# Patient Record
Sex: Female | Born: 1975 | Race: Black or African American | Hispanic: No | Marital: Married | Smoking: Never smoker
Health system: Southern US, Community
[De-identification: ages and names within clinical notes are randomized; demographics above are authoritative.]

## PROBLEM LIST (undated history)

## (undated) DIAGNOSIS — R7303 Prediabetes: Secondary | ICD-10-CM

## (undated) DIAGNOSIS — R519 Headache, unspecified: Secondary | ICD-10-CM

## (undated) DIAGNOSIS — F32A Depression, unspecified: Secondary | ICD-10-CM

## (undated) DIAGNOSIS — T7840XA Allergy, unspecified, initial encounter: Secondary | ICD-10-CM

## (undated) DIAGNOSIS — G709 Myoneural disorder, unspecified: Secondary | ICD-10-CM

## (undated) DIAGNOSIS — E669 Obesity, unspecified: Secondary | ICD-10-CM

## (undated) DIAGNOSIS — I10 Essential (primary) hypertension: Secondary | ICD-10-CM

## (undated) DIAGNOSIS — R011 Cardiac murmur, unspecified: Secondary | ICD-10-CM

## (undated) DIAGNOSIS — E079 Disorder of thyroid, unspecified: Secondary | ICD-10-CM

## (undated) DIAGNOSIS — D649 Anemia, unspecified: Secondary | ICD-10-CM

## (undated) DIAGNOSIS — K219 Gastro-esophageal reflux disease without esophagitis: Secondary | ICD-10-CM

## (undated) DIAGNOSIS — N200 Calculus of kidney: Secondary | ICD-10-CM

## (undated) DIAGNOSIS — K760 Fatty (change of) liver, not elsewhere classified: Secondary | ICD-10-CM

## (undated) DIAGNOSIS — N6001 Solitary cyst of right breast: Secondary | ICD-10-CM

## (undated) HISTORY — DX: Cardiac murmur, unspecified: R01.1

## (undated) HISTORY — DX: Headache, unspecified: R51.9

## (undated) HISTORY — DX: Obesity, unspecified: E66.9

## (undated) HISTORY — PX: ABDOMINAL HYSTERECTOMY: SHX81

## (undated) HISTORY — PX: TUBAL LIGATION: SHX77

## (undated) HISTORY — DX: Allergy, unspecified, initial encounter: T78.40XA

## (undated) HISTORY — DX: Calculus of kidney: N20.0

## (undated) HISTORY — DX: Anemia, unspecified: D64.9

## (undated) HISTORY — DX: Essential (primary) hypertension: I10

## (undated) HISTORY — DX: Depression, unspecified: F32.A

## (undated) HISTORY — DX: Fatty (change of) liver, not elsewhere classified: K76.0

## (undated) HISTORY — DX: Prediabetes: R73.03

## (undated) HISTORY — DX: Gastro-esophageal reflux disease without esophagitis: K21.9

## (undated) HISTORY — DX: Disorder of thyroid, unspecified: E07.9

## (undated) HISTORY — DX: Myoneural disorder, unspecified: G70.9

---

## 2009-03-28 ENCOUNTER — Ambulatory Visit: Payer: Self-pay | Admitting: Family Medicine

## 2009-03-28 DIAGNOSIS — M546 Pain in thoracic spine: Secondary | ICD-10-CM | POA: Insufficient documentation

## 2013-12-24 ENCOUNTER — Encounter: Payer: Self-pay | Admitting: Emergency Medicine

## 2013-12-24 ENCOUNTER — Emergency Department (INDEPENDENT_AMBULATORY_CARE_PROVIDER_SITE_OTHER)
Admission: EM | Admit: 2013-12-24 | Discharge: 2013-12-24 | Disposition: A | Discharge status: Home / Self Care | Payer: BC Managed Care – PPO | Source: Home / Self Care | Attending: Family Medicine | Admitting: Family Medicine

## 2013-12-24 DIAGNOSIS — K219 Gastro-esophageal reflux disease without esophagitis: Secondary | ICD-10-CM

## 2013-12-24 MED ORDER — OMEPRAZOLE 40 MG PO CPDR: 40.0000 mg | DELAYED_RELEASE_CAPSULE | Freq: Every day | ORAL | Status: DC

## 2013-12-24 NOTE — ED Provider Notes (Signed)
CSN: [id number]     Arrival date & time 12/24/13  5009 History   First MD Initiated Contact with Patient 12/24/13 [phone]     Chief Complaint  Patient presents with  . Gastrophageal Reflux      HPI Comments: Patient reports that 5 days ago she ate some tacos and then slept for two hours.  Afterwards she developed a cough, worse at night and early morning, and irritation in her throat.  She has been hoarse for 3 days.  Patient is a 38 y.o. female presenting with GERD. The history is provided by the patient.  Gastrophageal Reflux This is a new problem. Episode onset: 5 days ago. The problem occurs daily. The problem has not changed since onset.Associated symptoms include chest pain. Pertinent negatives include no abdominal pain and no shortness of breath. The symptoms are aggravated by eating (supine in bed). Nothing relieves the symptoms. She has tried nothing for the symptoms.    History reviewed. No pertinent past medical history. Past Surgical History  Procedure Laterality Date  . Abdominal hysterectomy    . Tubal ligation     Family History  Problem Relation Age of Onset  . Hypertension Mother   . Gout Mother   . Hyperlipidemia Father   . Hypertension Brother   . Gout Brother    History  Substance Use Topics  . Smoking status: Never Smoker   . Smokeless tobacco: Not on file  . Alcohol Use: No   OB History   Grav Para Term Preterm Abortions TAB SAB Ect Mult Living                 Review of Systems  Respiratory: Negative for shortness of breath.   Cardiovascular: Positive for chest pain.  Gastrointestinal: Negative for nausea, vomiting, abdominal pain, blood in stool and abdominal distention.  All other systems reviewed and are negative.   Allergies  Latex and Morphine and related  Home Medications   Prior to Admission medications   Medication Sig Start Date End Date Taking? Authorizing Provider  omeprazole (PRILOSEC) 40 MG capsule Take 1 capsule (40 mg total) by  mouth daily. Take 30 minutes before a meal. 12/24/13   Kandra Nicolas, MD   BP 121/79  Pulse 81  Temp(Src) 98.1 F (36.7 C) (Oral)  Resp 16  Ht 5\' 2"  (1.575 m)  Wt 183 lb (83.008 kg)  BMI 33.46 kg/m2  SpO2 99% Physical Exam Nursing notes and Vital Signs reviewed. Appearance:  Patient appears stated age, and in no acute distress.  Patient is obese (BMI 33.5) Eyes:  Pupils are equal, round, and reactive to light and accomodation.  Extraocular movement is intact.  Conjunctivae are not inflamed  Pharynx:  Normal Neck:  Supple.  No adenopathy Lungs:  Clear to auscultation.  Breath sounds are equal.  Heart:  Regular rate and rhythm without murmurs, rubs, or gallops.  Abdomen:  Nontender without masses or hepatosplenomegaly.  Bowel sounds are present.  No CVA or flank tenderness.  Extremities:  No edema.  No calf tenderness Skin:  No rash present.   ED Course  Procedures  none      MDM   1. Gastroesophageal reflux disease, esophagitis presence not specified    Begin omeprazole. Followup with Family Doctor in about 3 weeks.     Kandra Nicolas, MD 12/27/13 2228

## 2013-12-24 NOTE — ED Notes (Signed)
Pt c/o burning, hoarseness, irritation, and itching in her throat x 5 days after eating tacos. She also c/o a cough.

## 2016-05-22 LAB — HM MAMMOGRAPHY

## 2017-06-17 ENCOUNTER — Ambulatory Visit (INDEPENDENT_AMBULATORY_CARE_PROVIDER_SITE_OTHER): Payer: BLUE CROSS/BLUE SHIELD

## 2017-06-17 ENCOUNTER — Encounter: Payer: Self-pay | Admitting: Physician Assistant

## 2017-06-17 ENCOUNTER — Ambulatory Visit (INDEPENDENT_AMBULATORY_CARE_PROVIDER_SITE_OTHER): Payer: BLUE CROSS/BLUE SHIELD | Admitting: Physician Assistant

## 2017-06-17 VITALS — BP 120/82 | HR 71 | Temp 98.0°F | Resp 16 | Ht 62.0 in | Wt 197.7 lb

## 2017-06-17 DIAGNOSIS — R93429 Abnormal radiologic findings on diagnostic imaging of unspecified kidney: Secondary | ICD-10-CM | POA: Diagnosis not present

## 2017-06-17 DIAGNOSIS — R93422 Abnormal radiologic findings on diagnostic imaging of left kidney: Secondary | ICD-10-CM

## 2017-06-17 DIAGNOSIS — Z131 Encounter for screening for diabetes mellitus: Secondary | ICD-10-CM | POA: Diagnosis not present

## 2017-06-17 DIAGNOSIS — Z87898 Personal history of other specified conditions: Secondary | ICD-10-CM

## 2017-06-17 DIAGNOSIS — Z800 Family history of malignant neoplasm of digestive organs: Secondary | ICD-10-CM | POA: Diagnosis not present

## 2017-06-17 DIAGNOSIS — Z7689 Persons encountering health services in other specified circumstances: Secondary | ICD-10-CM | POA: Diagnosis not present

## 2017-06-17 DIAGNOSIS — Z13220 Encounter for screening for lipoid disorders: Secondary | ICD-10-CM | POA: Diagnosis not present

## 2017-06-17 DIAGNOSIS — Z87442 Personal history of urinary calculi: Secondary | ICD-10-CM | POA: Diagnosis not present

## 2017-06-17 DIAGNOSIS — Z90710 Acquired absence of both cervix and uterus: Secondary | ICD-10-CM

## 2017-06-17 DIAGNOSIS — R739 Hyperglycemia, unspecified: Secondary | ICD-10-CM

## 2017-06-17 DIAGNOSIS — N2889 Other specified disorders of kidney and ureter: Secondary | ICD-10-CM | POA: Diagnosis not present

## 2017-06-17 DIAGNOSIS — Z1231 Encounter for screening mammogram for malignant neoplasm of breast: Secondary | ICD-10-CM

## 2017-06-17 HISTORY — DX: Abnormal radiologic findings on diagnostic imaging of unspecified kidney: R93.429

## 2017-06-17 HISTORY — DX: Acquired absence of both cervix and uterus: Z90.710

## 2017-06-17 HISTORY — DX: Personal history of other specified conditions: Z87.898

## 2017-06-17 LAB — POCT URINALYSIS DIPSTICK
Bilirubin, UA: NEGATIVE
Blood, UA: NEGATIVE
Glucose, UA: NEGATIVE
Ketones, UA: NEGATIVE
Leukocytes, UA: NEGATIVE
Nitrite, UA: NEGATIVE
PH UA: 5.5 (ref 5.0–8.0)
Protein, UA: NEGATIVE
Spec Grav, UA: 1.025 (ref 1.010–1.025)
Urobilinogen, UA: 0.2 E.U./dL

## 2017-06-17 NOTE — Patient Instructions (Addendum)
I have also ordered fasting labs. The lab is a walk-in open M-F 7:30a-4:30p (closed 12:30-1:30p). Nothing to eat or drink after midnight or at least 8 hours before your blood draw. You can have water and your medications.    Physical Activity Recommendations for modifying lipids and lowering blood pressure Engage in aerobic physical activity to reduce LDL-cholesterol, non-HDL-cholesterol, and blood pressure  Frequency: 3-4 sessions per week  Intensity: moderate to vigorous  Duration: 40 minutes on average  Physical Activity Recommendations for secondary prevention 1. Aerobic exercise  Frequency: 3-5 sessions per week  Intensity: 50-80% capacity  Duration: 20 - 60 minutes  Examples: walking, treadmill, cycling, rowing, stair climbing, and arm/leg ergometry  2. Resistance exercise  Frequency: 2-3 sessions per week  Intensity: 10-15 repetitions/set to moderate fatigue  Duration: 1-3 sets of 8-10 upper and lower body exercises  Examples: calisthenics, elastic bands, cuff/hand weights, dumbbels, free weights, wall pulleys, and weight machines  Heart-Healthy Lifestyle  Eating a diet rich in vegetables, fruits and whole grains: also includes low-fat dairy products, poultry, fish, legumes, and nuts; limit intake of sweets, sugar-sweetened beverages and red meats  Getting regular exercise  Maintaining a healthy weight  Not smoking or getting help quitting  Staying on top of your health; for some people, lifestyle changes alone may not be enough to prevent a heart attack or stroke. In these cases, taking a statin at the right dose will most likely be necessary

## 2017-06-17 NOTE — Progress Notes (Signed)
HPI:                                                                Amber Mcmillan is a 42 y.o. female who presents to [facility] today to establish care  Current concerns: "kidney lesion"  CT abdomen on 06/05/17 showed a nonspecific ill-defined hypodensity of the left lower pole of the left kidney. At that time patient presented with left-sided back/flank pain. Reports pain was sharp, stabbing, persistent and worse with coughing. States her back pain resolved on Saturday. Reports history of nephrolithiasis in the right kidney. Denies hematuria, dysuria, frequency, urgency. She does not smoke or use tobacco products  Depression screen PHQ 2/9 06/17/2017  Decreased Interest 0  Down, Depressed, Hopeless 0  PHQ - 2 Score 0    Past Medical History:  Diagnosis Date  . Nephrolithiasis   . Prediabetes 06/19/2017   Past Surgical History:  Procedure Laterality Date  . ABDOMINAL HYSTERECTOMY    . TUBAL LIGATION     Social History   Tobacco Use  . Smoking status: Never Smoker  . Smokeless tobacco: Never Used  Substance Use Topics  . Alcohol use: No   family history includes Colon cancer in her maternal grandmother, paternal uncle, and paternal uncle; Diabetes in her maternal aunt; Gout in her brother and mother; Hyperlipidemia in her father; Hypertension in her brother and mother; Renal Disease in her maternal aunt.  ROS: negative except as noted in the HPI  Medications: No current outpatient medications on file.   No current facility-administered medications for this visit.    Allergies  Allergen Reactions  . Latex   . Morphine And Related        Objective:  BP 120/82   Pulse 71   Temp 98 F (36.7 C) (Oral)   Resp 16   Ht 5\' 2"  (1.575 m)   Wt 197 lb 11.2 oz (89.7 kg)   SpO2 98%   BMI 36.16 kg/m  Gen:  alert, not ill-appearing, no distress, appropriate for age, obese female HEENT: head normocephalic without obvious  abnormality, conjunctiva and cornea clear, trachea midline Pulm: Normal work of breathing, normal phonation, clear to auscultation bilaterally, no wheezes, rales or rhonchi CV: Normal rate, regular rhythm, s1 and s2 distinct, no murmurs, clicks or rubs  GI: abdomen soft, nontender, no CVA tenderness Neuro: alert and oriented x 3, no tremor MSK: extremities atraumatic, normal gait and station Skin: intact, no rashes on exposed skin, no jaundice, no cyanosis Psych: well-groomed, cooperative, good eye contact, euthymic mood, affect mood-congruent, speech is articulate, and thought processes clear and goal-directed   Ref Range & Units 10d ago  Color, UA  yellow   Clarity, UA  clear   Glucose, UA  Negative   Bilirubin, UA  Negative   Ketones, UA  Negative   Spec Grav, UA 1.010 - 1.025 1.025   Blood, UA  Negative   pH, UA 5.0 - 8.0 5.5   Protein, UA  Negative   Urobilinogen, UA 0.2 or 1.0 E.U./dL 0.2   Nitrite, UA  Negative   Leukocytes, UA Negative Negative   Appearance  straw   Odor  yes       Specimen Collected: 06/17/17 10:19 Last  Resulted: 06/17/17 10:19        No results found for this or any previous visit (from the past 72 hour(s)). No results found.    Assessment and Plan: 42 y.o. female with   Encounter to establish care - Reviewed PMH, PSH, PFH, medications and allergies - Personally reviewed labs and CT from 06/05/2017 in [facility] - Reviewed health maintenance - No longer candidate for Pap smear - Mammogram due - Declines influenza - PHQ2 negative  1. Abnormal CT scan, kidney - POCT urinalysis dipstick negative, no hematuria or proteinuria - US RENAL ordered today was not diagnostic and MRI was recommended to better characterize the area - MR Abdomen W Wo Contrast; Future  2. H/O hysterectomy for benign disease  3. Breast cancer screening by mammogram - MM DIGITAL SCREENING BILATERAL; Future  4. History of abnormal mammogram - MM DIGITAL  SCREENING BILATERAL; Future  5. Hyperglycemia - Hemoglobin A1c  6. Screening for lipid disorders - Lipid Panel w/reflex Direct LDL  7. Screening for diabetes mellitus - Hemoglobin A1c   9. Other specified disorders of kidney and ureter - MR Abdomen W Wo Contrast; Future  10. Family history of colon cancer - 3, second-degree relatives  23. History of nephrolithiasis - POCT urinalysis dipstick negative  Patient education and anticipatory guidance given Patient agrees with treatment plan Follow-up in 3 months or sooner as needed if symptoms worsen or fail to improve  Darlyne Russian PA-C

## 2017-06-18 LAB — LIPID PANEL W/REFLEX DIRECT LDL
Cholesterol: 195 mg/dL (ref <200)
HDL: 61 mg/dL (ref >50)
LDL CHOLESTEROL (CALC): 117 mg/dL — AB
NON-HDL CHOLESTEROL (CALC): 134 mg/dL — AB (ref <130)
Total CHOL/HDL Ratio: 3.2 (calc) (ref <5.0)
Triglycerides: 76 mg/dL (ref <150)

## 2017-06-18 LAB — HEMOGLOBIN A1C
Hgb A1c MFr Bld: 5.9 % of total Hgb — ABNORMAL HIGH (ref <5.7)
Mean Plasma Glucose: 123 (calc)
eAG (mmol/L): 6.8 (calc)

## 2017-06-19 ENCOUNTER — Encounter: Payer: Self-pay | Admitting: Physician Assistant

## 2017-06-19 DIAGNOSIS — R7303 Prediabetes: Secondary | ICD-10-CM

## 2017-06-19 HISTORY — DX: Prediabetes: R73.03

## 2017-06-19 NOTE — Progress Notes (Signed)
Labs show prediabetes Cholesterol in a healthy range The ultrasound still shows a poorly defined region on the left kidney and radiologist recommends getting an MRI to better characterize it. I have placed the order

## 2017-06-24 ENCOUNTER — Encounter: Payer: Self-pay | Admitting: Physician Assistant

## 2017-06-27 ENCOUNTER — Encounter: Payer: Self-pay | Admitting: Physician Assistant

## 2017-06-27 DIAGNOSIS — Z800 Family history of malignant neoplasm of digestive organs: Secondary | ICD-10-CM | POA: Insufficient documentation

## 2017-06-27 DIAGNOSIS — N2889 Other specified disorders of kidney and ureter: Secondary | ICD-10-CM | POA: Insufficient documentation

## 2017-06-27 DIAGNOSIS — Z87442 Personal history of urinary calculi: Secondary | ICD-10-CM | POA: Insufficient documentation

## 2017-06-27 HISTORY — DX: Family history of malignant neoplasm of digestive organs: Z80.0

## 2017-06-27 HISTORY — DX: Personal history of urinary calculi: Z87.442

## 2017-07-04 ENCOUNTER — Encounter: Payer: Self-pay | Admitting: Physician Assistant

## 2017-07-04 ENCOUNTER — Ambulatory Visit: Payer: BLUE CROSS/BLUE SHIELD

## 2017-07-04 DIAGNOSIS — K760 Fatty (change of) liver, not elsewhere classified: Secondary | ICD-10-CM

## 2017-07-04 DIAGNOSIS — N2889 Other specified disorders of kidney and ureter: Secondary | ICD-10-CM

## 2017-07-04 DIAGNOSIS — R93429 Abnormal radiologic findings on diagnostic imaging of unspecified kidney: Secondary | ICD-10-CM

## 2017-07-04 HISTORY — DX: Fatty (change of) liver, not elsewhere classified: K76.0

## 2017-07-04 MED ORDER — GADOBENATE DIMEGLUMINE 529 MG/ML IV SOLN
18.0000 mL | Freq: Once | INTRAVENOUS | Status: AC | PRN
  Administered 2017-07-04: 18 mL via INTRAVENOUS

## 2017-07-04 NOTE — Progress Notes (Signed)
Radiologist thinks the area in question is a renal cyst It needs to be monitored to make sure it does not grow or change They recommend repeat MRI in 6 months I will order it now, but she should not have it scheduled until July

## 2017-08-04 ENCOUNTER — Ambulatory Visit
Admission: RE | Admit: 2017-08-04 | Discharge: 2017-08-04 | Disposition: A | Discharge status: Home / Self Care | Payer: BLUE CROSS/BLUE SHIELD | Source: Ambulatory Visit | Attending: Physician Assistant | Admitting: Physician Assistant

## 2017-08-04 DIAGNOSIS — Z87898 Personal history of other specified conditions: Secondary | ICD-10-CM

## 2017-08-04 DIAGNOSIS — Z1231 Encounter for screening mammogram for malignant neoplasm of breast: Secondary | ICD-10-CM

## 2017-08-05 ENCOUNTER — Other Ambulatory Visit: Payer: Self-pay | Admitting: Physician Assistant

## 2017-08-05 DIAGNOSIS — R928 Other abnormal and inconclusive findings on diagnostic imaging of breast: Secondary | ICD-10-CM

## 2017-08-09 ENCOUNTER — Ambulatory Visit
Admission: RE | Admit: 2017-08-09 | Discharge: 2017-08-09 | Disposition: A | Discharge status: Home / Self Care | Payer: BLUE CROSS/BLUE SHIELD | Source: Ambulatory Visit | Attending: Physician Assistant | Admitting: Physician Assistant

## 2017-08-09 ENCOUNTER — Encounter: Payer: Self-pay | Admitting: Physician Assistant

## 2017-08-09 DIAGNOSIS — N6001 Solitary cyst of right breast: Secondary | ICD-10-CM | POA: Insufficient documentation

## 2017-08-09 DIAGNOSIS — R928 Other abnormal and inconclusive findings on diagnostic imaging of breast: Secondary | ICD-10-CM

## 2017-08-09 HISTORY — DX: Solitary cyst of right breast: N60.01

## 2017-09-16 ENCOUNTER — Encounter: Payer: Self-pay | Admitting: Physician Assistant

## 2017-09-16 ENCOUNTER — Ambulatory Visit (INDEPENDENT_AMBULATORY_CARE_PROVIDER_SITE_OTHER): Payer: BLUE CROSS/BLUE SHIELD | Admitting: Physician Assistant

## 2017-09-16 VITALS — BP 134/89 | HR 77 | Wt 196.0 lb

## 2017-09-16 DIAGNOSIS — K760 Fatty (change of) liver, not elsewhere classified: Secondary | ICD-10-CM | POA: Diagnosis not present

## 2017-09-16 DIAGNOSIS — E6609 Other obesity due to excess calories: Secondary | ICD-10-CM | POA: Diagnosis not present

## 2017-09-16 DIAGNOSIS — Z23 Encounter for immunization: Secondary | ICD-10-CM | POA: Diagnosis not present

## 2017-09-16 DIAGNOSIS — R030 Elevated blood-pressure reading, without diagnosis of hypertension: Secondary | ICD-10-CM | POA: Insufficient documentation

## 2017-09-16 DIAGNOSIS — Z0000 Encounter for general adult medical examination without abnormal findings: Secondary | ICD-10-CM | POA: Diagnosis not present

## 2017-09-16 DIAGNOSIS — R7303 Prediabetes: Secondary | ICD-10-CM

## 2017-09-16 DIAGNOSIS — F5101 Primary insomnia: Secondary | ICD-10-CM

## 2017-09-16 MED ORDER — TOPIRAMATE 25 MG PO TABS: ORAL_TABLET | ORAL | 0 refills | Status: DC

## 2017-09-16 NOTE — Progress Notes (Signed)
HPI:                                                                Amber Mcmillan is a 42 y.o. female who presents to [facility]: [facility] today for annual physical exam  Current concerns: weight  Reports she would like to lose 25-30 pounds to improve her health. Current weight 196 Lb for the last 6 months. Steady weight gain since 2010, approximately 26 pounds. Prior weight loss attempts: has tried traditional calorie restricted diets and aerobic exercise. Reports diets lead to binging behavior and her long hours at work make keeping a formal exercise regimen difficult. She currently eats 2 meals per day, skips 1 meal frequently due to busy schedule Drinks large amounts of sweet tea and lemonade, but was able to cut out soda.  Depression screen PHQ 2/9 06/17/2017  Decreased Interest 0  Down, Depressed, Hopeless 0  PHQ - 2 Score 0    No flowsheet data found.    Past Medical History:  Diagnosis Date  . Benign cyst of right breast 08/09/2017  . Fatty infiltration of liver 07/04/2017  . Hypertension   . Nephrolithiasis   . Obesity   . Prediabetes 06/19/2017   Past Surgical History:  Procedure Laterality Date  . ABDOMINAL HYSTERECTOMY    . TUBAL LIGATION     Social History   Tobacco Use  . Smoking status: Never Smoker  . Smokeless tobacco: Never Used  Substance Use Topics  . Alcohol use: No   family history includes Colon cancer in her maternal grandmother, paternal uncle, and paternal uncle; Diabetes in her maternal aunt; Gout in her brother and mother; Hyperlipidemia in her father; Hypertension in her brother and mother; Renal Disease in her maternal aunt.    ROS: negative except as noted in the HPI  Medications: Current Outpatient Medications  Medication Sig Dispense Refill  . topiramate (TOPAMAX) 25 MG tablet Take 1 tablet (25 mg total) by mouth at bedtime for 7 days, THEN 1 tablet (25 mg total) 2 (two) times daily for 7  days, THEN 2 tablets (50 mg total) 2 (two) times daily for 14 days. 90 tablet 0   No current facility-administered medications for this visit.    Allergies  Allergen Reactions  . Latex   . Morphine And Related        Objective:  BP 134/89   Pulse 77   Wt 196 lb (88.9 kg)   BMI 35.85 kg/m  General Appearance:  Alert, cooperative, no distress, appropriate for age                            Head:  Normocephalic, without obvious abnormality                             Eyes:  PERRL, EOM's intact, conjunctiva and cornea clear                             Ears:  TM pearly gray color and semitransparent, external ear canals normal, both ears  Nose:  Nares symmetrical                          Throat:  Lips, tongue, and mucosa are moist, pink, and intact; good dentition                             Neck:  Supple; symmetrical, trachea midline, no adenopathy; thyroid: no enlargement, symmetric, no tenderness/mass/nodules                             Back:  Symmetrical, no curvature, ROM normal               Chest/Breast:  deferred                           Lungs:  Clear to auscultation bilaterally, respirations unlabored                             Heart:  regular rate & normal rhythm, S1 and S2 normal, no murmurs, rubs, or gallops                     Abdomen:  Soft, non-tender, no mass or organomegaly              Genitourinary:  deferred         Musculoskeletal:  Tone and strength strong and symmetrical, all extremities; no joint pain or edema, normal gait and station                                     Lymphatic:  No adenopathy             Skin/Hair/Nails:  Skin warm, dry and intact, no rashes or abnormal dyspigmentation on limited exam                   Neurologic:  Alert and oriented x3, no cranial nerve deficits, DTR's intact, sensation grossly intact, normal gait and station, no tremor Psych: well-groomed, cooperative, good eye contact, euthymic mood, affect  mood-congruent, speech is articulate, and thought processes clear and goal-directed    No results found for this or any previous visit (from the past 72 hour(s)). No results found.    Assessment and Plan: 42 y.o. female with   Encounter for annual physical exam  Class 2 obesity due to excess calories without serious comorbidity in adult, unspecified BMI - Plan: topiramate (TOPAMAX) 25 MG tablet  Fatty infiltration of liver  Elevated blood pressure reading  Prediabetes  Need for Tdap vaccination - Plan: Tdap vaccine greater than or equal to 7yo IM  Primary insomnia - Melatonin 3-5 mg QHS  Plan for medically assisted weight loss using Topamax  Weight Loss Goals Nutrition:  Cut out sweetened beverages. Switch to unsweetened or sugar free alternative Avoid skipping meals. Eat 3 meals per day or graze every 4 hours Exercise: Increase physical activity  - take the stairs when you can - park further away in the parking lot - go for a walk during your lunch break - every hour, stand at your desk for 2 minutes   Patient education and anticipatory guidance given Patient agrees with treatment plan Follow-up in 1  month for weight as needed if symptoms worsen or fail to improve  Darlyne Russian PA-C

## 2017-09-16 NOTE — Patient Instructions (Addendum)
Weight Loss Goals Nutrition:  Cut out sweetened beverages. Switch to unsweetened or sugar free alternative Avoid skipping meals. Eat 3 meals per day or graze every 4 hours Exercise: Increase physical activity  - take the stairs when you can - park further away in the parking lot - go for a walk during your lunch break - every hour, stand at your desk for 2 minutes   Preventive Care 40-64 Years, Female Preventive care refers to lifestyle choices and visits with your health care provider that can promote health and wellness. What does preventive care include?  A yearly physical exam. This is also called an annual well check.  Dental exams once or twice a year.  Routine eye exams. Ask your health care provider how often you should have your eyes checked.  Personal lifestyle choices, including: ? Daily care of your teeth and gums. ? Regular physical activity. ? Eating a healthy diet. ? Avoiding tobacco and drug use. ? Limiting alcohol use. ? Practicing safe sex. ? Taking low-dose aspirin daily starting at age 87. ? Taking vitamin and mineral supplements as recommended by your health care provider. What happens during an annual well check? The services and screenings done by your health care provider during your annual well check will depend on your age, overall health, lifestyle risk factors, and family history of disease. Counseling Your health care provider may ask you questions about your:  Alcohol use.  Tobacco use.  Drug use.  Emotional well-being.  Home and relationship well-being.  Sexual activity.  Eating habits.  Work and work Statistician.  Method of birth control.  Menstrual cycle.  Pregnancy history.  Screening You may have the following tests or measurements:  Height, weight, and BMI.  Blood pressure.  Lipid and cholesterol levels. These may be checked every 5 years, or more frequently if you are over 56 years old.  Skin check.  Lung cancer  screening. You may have this screening every year starting at age 62 if you have a 30-pack-year history of smoking and currently smoke or have quit within the past 15 years.  Fecal occult blood test (FOBT) of the stool. You may have this test every year starting at age 43.  Flexible sigmoidoscopy or colonoscopy. You may have a sigmoidoscopy every 5 years or a colonoscopy every 10 years starting at age 61.  Hepatitis C blood test.  Hepatitis B blood test.  Sexually transmitted disease (STD) testing.  Diabetes screening. This is done by checking your blood sugar (glucose) after you have not eaten for a while (fasting). You may have this done every 1-3 years.  Mammogram. This may be done every 1-2 years. Talk to your health care provider about when you should start having regular mammograms. This may depend on whether you have a family history of breast cancer.  BRCA-related cancer screening. This may be done if you have a family history of breast, ovarian, tubal, or peritoneal cancers.  Pelvic exam and Pap test. This may be done every 3 years starting at age 71. Starting at age 37, this may be done every 5 years if you have a Pap test in combination with an HPV test.  Bone density scan. This is done to screen for osteoporosis. You may have this scan if you are at high risk for osteoporosis.  Discuss your test results, treatment options, and if necessary, the need for more tests with your health care provider. Vaccines Your health care provider may recommend certain vaccines, such as:  Influenza vaccine. This is recommended every year.  Tetanus, diphtheria, and acellular pertussis (Tdap, Td) vaccine. You may need a Td booster every 10 years.  Varicella vaccine. You may need this if you have not been vaccinated.  Zoster vaccine. You may need this after age 57.  Measles, mumps, and rubella (MMR) vaccine. You may need at least one dose of MMR if you were born in 1957 or later. You may  also need a second dose.  Pneumococcal 13-valent conjugate (PCV13) vaccine. You may need this if you have certain conditions and were not previously vaccinated.  Pneumococcal polysaccharide (PPSV23) vaccine. You may need one or two doses if you smoke cigarettes or if you have certain conditions.  Meningococcal vaccine. You may need this if you have certain conditions.  Hepatitis A vaccine. You may need this if you have certain conditions or if you travel or work in places where you may be exposed to hepatitis A.  Hepatitis B vaccine. You may need this if you have certain conditions or if you travel or work in places where you may be exposed to hepatitis B.  Haemophilus influenzae type b (Hib) vaccine. You may need this if you have certain conditions.  Talk to your health care provider about which screenings and vaccines you need and how often you need them. This information is not intended to replace advice given to you by your health care provider. Make sure you discuss any questions you have with your health care provider. Document Released: 06/27/2015 Document Revised: 02/18/2016 Document Reviewed: 04/01/2015 Elsevier Interactive Patient Education  [employer].

## 2017-10-17 ENCOUNTER — Ambulatory Visit: Payer: BLUE CROSS/BLUE SHIELD | Admitting: Physician Assistant

## 2017-10-17 ENCOUNTER — Encounter: Payer: Self-pay | Admitting: Physician Assistant

## 2017-10-17 VITALS — BP 119/82 | HR 74 | Wt 189.0 lb

## 2017-10-17 DIAGNOSIS — H53451 Other localized visual field defect, right eye: Secondary | ICD-10-CM | POA: Insufficient documentation

## 2017-10-17 DIAGNOSIS — Z713 Dietary counseling and surveillance: Secondary | ICD-10-CM

## 2017-10-17 DIAGNOSIS — E6609 Other obesity due to excess calories: Secondary | ICD-10-CM | POA: Diagnosis not present

## 2017-10-17 MED ORDER — TOPIRAMATE ER 100 MG PO CAP24: 100.0000 mg | ORAL_CAPSULE | Freq: Every day | ORAL | 1 refills | Status: DC

## 2017-10-17 NOTE — Patient Instructions (Signed)
Goals 1. Pack nutritious lunch daily 2. Increase physical activity - download MapMyRun and MitFitnessPal  Weight Loss plan - 1250 calorie moderate protein diet - 30-35% calories from protein - 30% calories from fat - 35-40% from carbs: If diabetic, limit carbs to 30 g per meal and 15 g per snack - MEASURE your calories (MyFitnessPal) - 8-11 glasses of water per day - limit caffeine to 1 unsweetened beverage per day - avoid fried foods, saturated fat, and heavily processed foods - keep sugar as low as possible  - follow DASH or Mediterranean diets for recipes - avoid skipping meals. Eat 3 regular meals per day with 1-2 snacks (stay within your calorie goal) OR graze every 4 hours. The important thing is to stay on a schedule - avoid eating within 2 hours of bedtime

## 2017-10-17 NOTE — Progress Notes (Signed)
HPI:                                                                Amber Mcmillan is a 42 y.o. female who presents to [facility] [place] today for weight check  Current weight:189 Lb  Has lost 7 pounds in 4 weeks Reports she is drinking Crystal Light and Green Tea She is eating 3 regular meals per day Has noted some fatigue and with the Topamax, which are improving.  Reports developing intermittent vision change in her right peripheral field where she is having a brief white flash. Occurs momentarily a few times per day. She wears glasses and contact lenses and was last evaluated by her eye doctor last year, no abnormalities. No family history of glaucoma.    No flowsheet data found.    Past Medical History:  Diagnosis Date  . Benign cyst of right breast 08/09/2017  . Fatty infiltration of liver 07/04/2017  . Hypertension   . Nephrolithiasis   . Obesity   . Prediabetes 06/19/2017   Past Surgical History:  Procedure Laterality Date  . ABDOMINAL HYSTERECTOMY    . TUBAL LIGATION     Social History   Tobacco Use  . Smoking status: Never Smoker  . Smokeless tobacco: Never Used  Substance Use Topics  . Alcohol use: No   family history includes Colon cancer in her maternal grandmother, paternal uncle, and paternal uncle; Diabetes in her maternal aunt; Gout in her brother and mother; Hyperlipidemia in her father; Hypertension in her brother and mother; Renal Disease in her maternal aunt.    ROS: negative except as noted in the HPI  Medications: Current Outpatient Medications  Medication Sig Dispense Refill  . topiramate (TOPAMAX) 25 MG tablet Take 1 tablet (25 mg total) by mouth at bedtime for 7 days, THEN 1 tablet (25 mg total) 2 (two) times daily for 7 days, THEN 2 tablets (50 mg total) 2 (two) times daily for 14 days. 90 tablet 0   No current facility-administered medications for this visit.    Allergies  Allergen Reactions   . Latex   . Morphine And Related        Objective:  BP 119/82   Pulse 74   Wt 189 lb (85.7 kg)   BMI 34.57 kg/m  Gen:  alert, not ill-appearing, no distress, appropriate for age, obese female HEENT: head normocephalic without obvious abnormality, conjunctiva and cornea clear, PERRL, EOM's intact, visual fields intact to confrontation, trachea midline Pulm: Normal work of breathing, normal phonation, clear to auscultation bilaterally, no wheezes, rales or rhonchi CV: Normal rate, regular rhythm, s1 and s2 distinct, no murmurs, clicks or rubs  Neuro: alert and oriented x 3, no tremor MSK: extremities atraumatic, normal gait and station Skin: intact, no rashes on exposed skin, no jaundice, no cyanosis Psych: well-groomed, cooperative, good eye contact, euthymic mood, affect mood-congruent, speech is articulate, and thought processes clear and goal-directed    No results found for this or any previous visit (from the past 72 hour(s)). No results found.    Assessment and Plan: 42 y.o. female with   Encounter for weight loss counseling  Class 2 obesity due to excess calories without serious comorbidity in adult, unspecified BMI -  Plan: Topiramate ER (TROKENDI XR) 100 MG CP24  Abnormal peripheral vision of right eye  Wt Readings from Last 3 Encounters:  10/17/17 189 lb (85.7 kg)  09/16/17 196 lb (88.9 kg)  06/17/17 197 lb 11.2 oz (89.7 kg)  - has lost 7 pounds  - switching to Trokendi due to side effect with Topamax  Goals 1. Pack nutritious lunch daily 2. Increase physical activity - download MapMyRun and MitFitnessPal  Weight Loss plan - 1250 calorie moderate protein diet - 30-35% calories from protein - 30% calories from fat - 35-40% from carbs: If diabetic, limit carbs to 30 g per meal and 15 g per snack - MEASURE your calories (MyFitnessPal) - 8-11 glasses of water per day - limit caffeine to 1 unsweetened beverage per day - avoid fried foods, saturated fat,  and heavily processed foods - keep sugar as low as possible  - follow DASH or Mediterranean diets for recipes  Patient education and anticipatory guidance given Patient agrees with treatment plan Follow-up in 1 month for weight check or sooner as needed if symptoms worsen or fail to improve  Darlyne Russian PA-C

## 2017-10-18 ENCOUNTER — Encounter: Payer: Self-pay | Admitting: Physician Assistant

## 2017-11-14 ENCOUNTER — Ambulatory Visit (INDEPENDENT_AMBULATORY_CARE_PROVIDER_SITE_OTHER): Payer: BLUE CROSS/BLUE SHIELD | Admitting: Sports Medicine

## 2017-11-14 DIAGNOSIS — E6609 Other obesity due to excess calories: Secondary | ICD-10-CM

## 2017-11-14 MED ORDER — TOPIRAMATE ER 100 MG PO CAP24: 100.0000 mg | ORAL_CAPSULE | Freq: Every day | ORAL | 1 refills | Status: DC

## 2017-11-14 MED ORDER — MOMETASONE FUROATE 0.1 % EX CREA: 1.0000 "application " | TOPICAL_CREAM | Freq: Every day | CUTANEOUS | 0 refills | Status: DC

## 2017-11-14 NOTE — Assessment & Plan Note (Signed)
Good weight loss with Trokendi, only 1 pound over the last week but I think we can give her another chance. Refilling Trokendi, nurse visit followup.

## 2017-11-14 NOTE — Progress Notes (Signed)
   Subjective:    Patient ID: TAKIYAH BOHNSACK, female    DOB: Aug 24, 1975, 42 y.o.   MRN: [id number]  HPI Pt here for weight check. Has lost 1 lb. States last week was her anniversary and got off track with her eating. No exercise this past week either. Pt has no C/O SOB, headache, dizziness, no trouble sleeping or medication problems.  Pt asks if Elocon can be called into her pharmacy. 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 Flare started 2 days ago on her chest,arms and neck, itching.   Review of Systems     Objective:   Physical Exam        Assessment & Plan:  Pt has had a 1lb weight loss since last visit. Pt advised to schedule a follow up in 1 month and will route to provider for refill or not.

## 2017-12-14 ENCOUNTER — Ambulatory Visit: Payer: BLUE CROSS/BLUE SHIELD

## 2018-01-23 ENCOUNTER — Encounter: Payer: Self-pay | Admitting: Osteopathic Medicine

## 2018-01-23 ENCOUNTER — Ambulatory Visit: Payer: BLUE CROSS/BLUE SHIELD | Admitting: Osteopathic Medicine

## 2018-01-23 VITALS — BP 114/74 | HR 56 | Wt 186.0 lb

## 2018-01-23 DIAGNOSIS — K625 Hemorrhage of anus and rectum: Secondary | ICD-10-CM

## 2018-01-23 NOTE — Patient Instructions (Signed)
Rectal Bleeding Rectal bleeding is when blood passes out of the anus. People with rectal bleeding may notice bright red blood in their underwear or in the toilet after having a bowel movement. They may also have dark red or black stools. Rectal bleeding is usually a sign that something is wrong. Many things can cause rectal bleeding, including:  Hemorrhoids. These are blood vessels in the anus or rectum that are larger than normal.  Fistulas. These are abnormal passages in the rectum and anus.  Anal fissures. This is a tear in the anus.  Diverticulosis. This is a condition in which pockets or sacs project from the bowel.  Proctitis and colitis. These are conditions in which the rectum, colon, or anus become inflamed.  Polyps. These are growths that can be cancerous (malignant) or non-cancerous (benign).  Part of the rectum sticking out from the anus (rectal prolapse).  Certain medicines.  Intestinal infections.  Follow these instructions at home: Pay attention to any changes in your symptoms. Take these actions to help lessen bleeding and discomfort:  Eat a diet that is high in fiber. This will keep your stool soft, making it easier to pass stools without straining. Ask your health care provider what foods and drinks are high in fiber.  Drink enough fluid to keep your urine clear or pale yellow. This also helps to keep your stool soft.  Try taking a warm bath. This may help soothe any pain in your rectum.  Keep all follow-up visits as told by your health care provider. This is important.  Get help right away if:  You have new or increased rectal bleeding.  You have black or dark red stools.  You vomit blood or something that looks like coffee grounds.  You have pain or tenderness in your abdomen.  You have a fever.  You feel weak.  You feel nauseous.  You faint.  You have severe pain in your rectum.  You cannot have a bowel movement. This information is not  intended to replace advice given to you by your health care provider. Make sure you discuss any questions you have with your health care provider. Document Released: 11/20/2001 Document Revised: 11/06/2015 Document Reviewed: 07/27/2015 Elsevier Interactive Patient Education  2018 Elsevier Inc.  

## 2018-01-23 NOTE — Progress Notes (Signed)
HPI: Amber Mcmillan is a 42 y.o. female who  has a past medical history of Benign cyst of right breast (08/09/2017), Fatty infiltration of liver (07/04/2017), Hypertension, Nephrolithiasis, Obesity, and Prediabetes (06/19/2017).  she presents to [facility] today, 01/23/18,  for chief complaint of:  Rectal bleeding  Rectal bleeding 5 and 4 days ago and none since then, x2, bright red about the size of a quarter, noticed on her underwear and w/ wiping, not w/ BM. Non-paintul recal area. S/p hysterectomy, no dysirua/urianry frequency or hematuria. Typical BM: once to three times per week. Last normal BM was 3 days ago. Typically no straining or pain but (+)abdominal fullness.     Past medical history, surgical history, and family history reviewed.  Current medication list and allergy/intolerance information reviewed.   (See remainder of HPI, ROS, Phys Exam below)    ASSESSMENT/PLAN:   Rectal bleeding - external hemorrhoids seem most likely, and problem has resolved. No masses, (-)POC hemoccult. Discussed constpation tx/prevention      Patient Instructions  Rectal Bleeding Rectal bleeding is when blood passes out of the anus. People with rectal bleeding may notice bright red blood in their underwear or in the toilet after having a bowel movement. They may also have dark red or black stools. Rectal bleeding is usually a sign that something is wrong. Many things can cause rectal bleeding, including:  Hemorrhoids. These are blood vessels in the anus or rectum that are larger than normal.  Fistulas. These are abnormal passages in the rectum and anus.  Anal fissures. This is a tear in the anus.  Diverticulosis. This is a condition in which pockets or sacs project from the bowel.  Proctitis and colitis. These are conditions in which the rectum, colon, or anus become inflamed.  Polyps. These are growths that can be cancerous (malignant) or non-cancerous  (benign).  Part of the rectum sticking out from the anus (rectal prolapse).  Certain medicines.  Intestinal infections.  Follow these instructions at home: Pay attention to any changes in your symptoms. Take these actions to help lessen bleeding and discomfort:  Eat a diet that is high in fiber. This will keep your stool soft, making it easier to pass stools without straining. Ask your health care provider what foods and drinks are high in fiber.  Drink enough fluid to keep your urine clear or pale yellow. This also helps to keep your stool soft.  Try taking a warm bath. This may help soothe any pain in your rectum.  Keep all follow-up visits as told by your health care provider. This is important.  Get help right away if:  You have new or increased rectal bleeding.  You have black or dark red stools.  You vomit blood or something that looks like coffee grounds.  You have pain or tenderness in your abdomen.  You have a fever.  You feel weak.  You feel nauseous.  You faint.  You have severe pain in your rectum.  You cannot have a bowel movement. This information is not intended to replace advice given to you by your health care provider. Make sure you discuss any questions you have with your health care provider. Document Released: 11/20/2001 Document Revised: 11/06/2015 Document Reviewed: 07/27/2015 Elsevier Interactive Patient Education  2018 [employer].    Follow-up plan: Return for recheck as needed / follow-up as directed by PCP for routine care .                      ############################################ ############################################ ############################################ ############################################  Outpatient Encounter Medications as of 01/23/2018  Medication Sig  . aspirin-acetaminophen-caffeine (EXCEDRIN MIGRAINE) 250-250-65 MG tablet Take by mouth every 6 (six) hours as needed.  .  mometasone (ELOCON) 0.1 % cream Apply 1 application topically daily.  . Topiramate ER (TROKENDI XR) 100 MG CP24 Take 100 mg by mouth at bedtime.   No facility-administered encounter medications on file as of 01/23/2018.    Allergies  Allergen Reactions  . Latex   . Morphine And Related       Review of Systems:  Constitutional: No recent illness  Cardiac: No  chest pain, No  pressure, No palpitations  Respiratory:  No  shortness of breath.  Gastrointestinal: No  abdominal pain, no change in bowel habits  Musculoskeletal: No new myalgia/arthralgia  Hem/Onc: No  easy bruising/bleeding elsewhere (gums, urine), No  abnormal lumps/bumps  Neurologic: No  weakness, No  Dizziness  Psychiatric: No  concerns with depression, No  concerns with anxiety  Exam:  BP 114/74   Pulse (!) 56   Wt 186 lb (84.4 kg)   BMI 34.02 kg/m   Constitutional: VS see above. General Appearance: alert, well-developed, well-nourished, NAD  Eyes: Normal lids and conjunctive, non-icteric sclera  Ears, Nose, Mouth, Throat: MMM, Normal external inspection ears/nares/mouth/lips/gums.  Neck: No masses, trachea midline.   Respiratory: Normal respiratory effort.   Rectal: (+)external hemorrhoids, non-inflamed, non-tender. (-)POC hemoccult but scant stool was collected.   Musculoskeletal: Gait normal. Symmetric and independent movement of all extremities  Neurological: Normal balance/coordination. No tremor.  Skin: warm, dry, intact.   Psychiatric: Normal judgment/insight. Normal mood and affect. Oriented x3.   Visit summary with medication list and pertinent instructions was printed for patient to review, advised to alert [place] if any changes needed. All questions at time of visit were answered - patient instructed to contact office with any additional concerns. ER/RTC precautions were reviewed with the patient and understanding verbalized.   Follow-up plan: Return for recheck as needed / follow-up as  directed by PCP for routine care .    Please note: voice recognition software was used to produce this document, and typos may escape review. Please contact Dr. Sheppard Coil for any needed clarifications.

## 2018-02-02 ENCOUNTER — Other Ambulatory Visit: Payer: Self-pay | Admitting: Sports Medicine

## 2018-02-02 DIAGNOSIS — E6609 Other obesity due to excess calories: Secondary | ICD-10-CM

## 2018-02-02 NOTE — Telephone Encounter (Signed)
To PCP

## 2018-09-16 IMAGING — MR MR ABDOMEN WO/W CM
16 of 17 series · 43 of 48 positions shown · IV contrast (18 ML MULTIHANCE)
Comparison: 06/17/2017 renal sonogram.

CLINICAL DATA: Indeterminate hypoechoic focus in the lower right
kidney on recent renal sonogram (the report described a left lower
pole renal hypoechoic focus, however the annotated ultrasound images
are clearly labeled right kidney at the area of concern).

EXAM:
MRI ABDOMEN WITHOUT AND WITH CONTRAST
TECHNIQUE: Multiplanar multisequence MR imaging of the abdomen was performed
both before and after the administration of intravenous contrast.
CONTRAST:  18mL MULTIHANCE GADOBENATE DIMEGLUMINE 529 MG/ML IV SOLN

[Series 2: cor ssfse / · coronal · 7.0mm · 1.48mm/px · 1 of 30 slices shown]
[im 1/30]
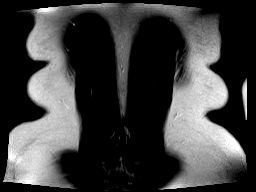

[Series 3: T2 · axial · 6.0mm · 1.48mm/px · 1 of 32 slices shown]
[im 1/32]
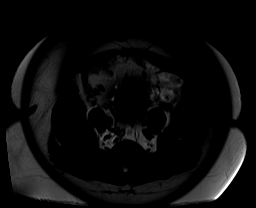

[Series 4: T1 · axial · 6.0mm · 0.74mm/px · z∈[-36,+188]mm · 3 of 64 slices shown]
[im 1/64]
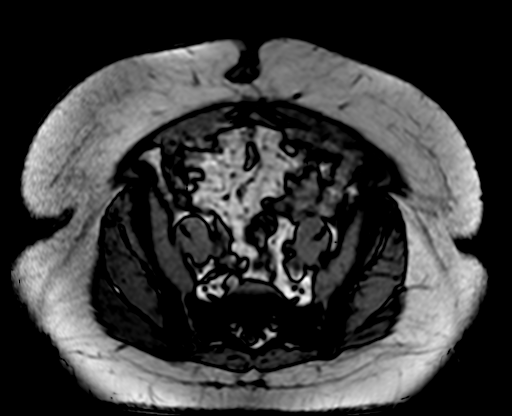
[im 32/64]
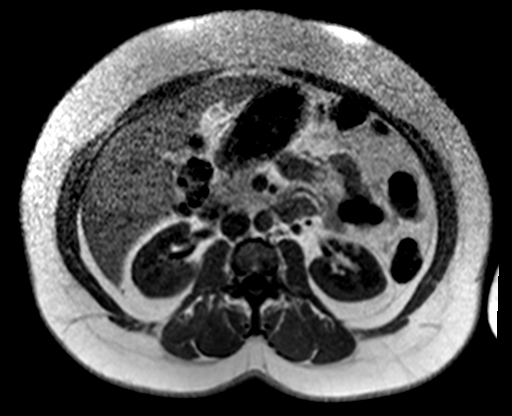
[im 64/64]
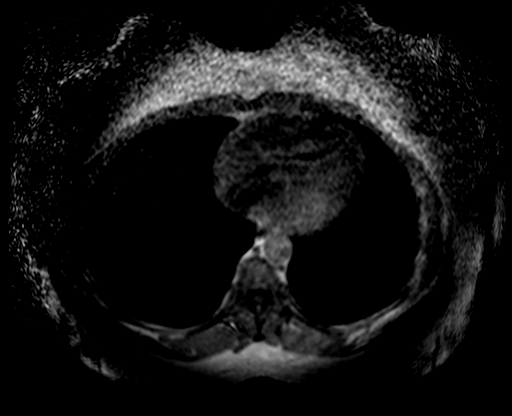

[Series 5: bSSFP · axial · 6.0mm · 0.74mm/px · z∈[-36,+188]mm · 2 of 32 slices shown]
[im 1/32]
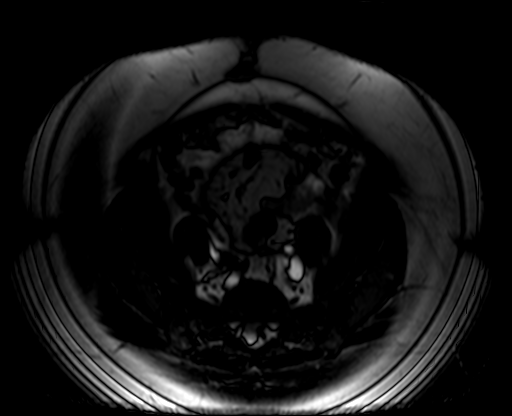
[im 32/32]
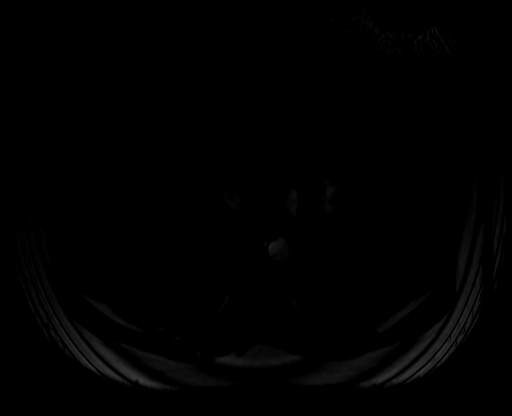

[Series 6: axial dynamic pre · axial · non-contrast · 4.0mm · 1.19mm/px · z∈[-50,+202]mm · 3 of 64 slices shown]
[im 1/64]
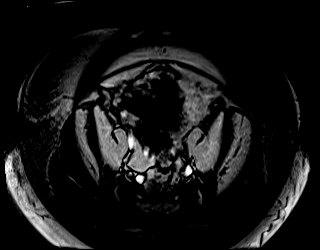
[im 32/64]
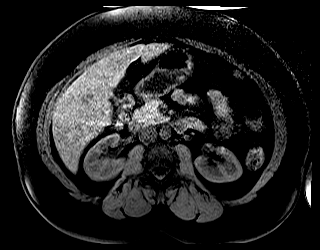
[im 64/64]
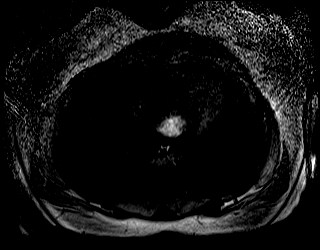

[Series 7: DWI · axial · 6.0mm · 2.00mm/px · z∈[-36,+188]mm · 5 of 96 slices shown (1 of 2)]
[im 1/96]
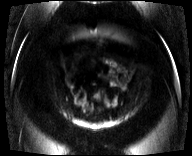
[im 24/96]
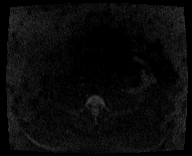
[im 48/96]
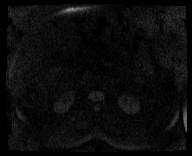
[im 72/96]
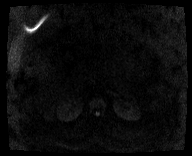
[im 96/96]
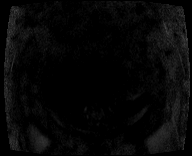

[Series 8: DWI · axial · 6.0mm · 2.00mm/px · z∈[-36,+188]mm · 2 of 32 slices shown (2 of 2)]
[im 1/32]
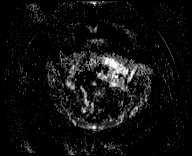
[im 32/32]
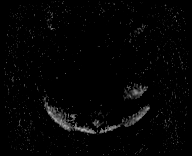

[Series 9: axial dynamic post · axial · 4.0mm · 1.19mm/px · z∈[-50,+202]mm · 3 of 64 slices shown (1 of 6)]
[im 1/64]
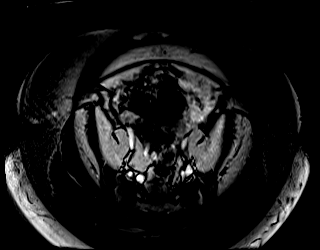
[im 32/64]
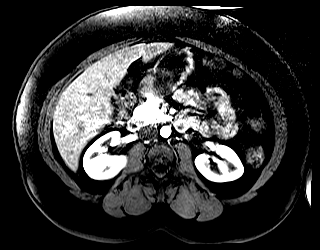
[im 64/64]
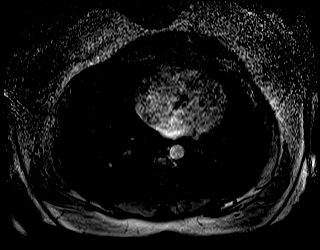

[Series 10: axial dynamic post · axial · 4.0mm · 1.19mm/px · z∈[-50,+202]mm · 3 of 64 slices shown (2 of 6)]
[im 1/64]
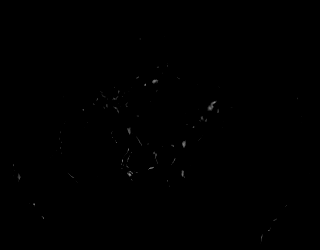
[im 32/64]
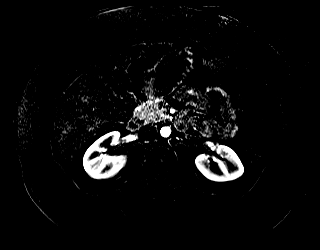
[im 64/64]
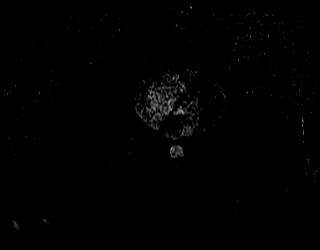

[Series 11: axial dynamic post · axial · 4.0mm · 1.19mm/px · z∈[-50,+202]mm · 3 of 64 slices shown (3 of 6)]
[im 1/64]
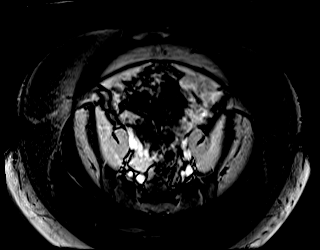
[im 32/64]
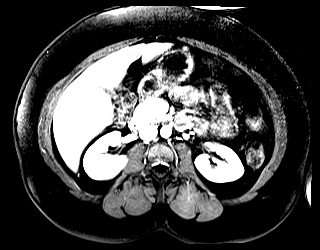
[im 64/64]
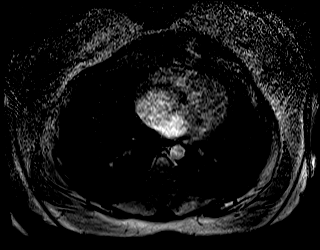

[Series 12: axial dynamic post · axial · 4.0mm · 1.19mm/px · z∈[-50,+202]mm · 3 of 64 slices shown (4 of 6)]
[im 1/64]
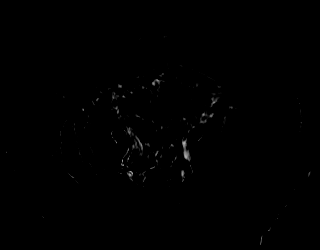
[im 32/64]
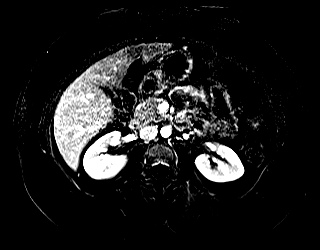
[im 64/64]
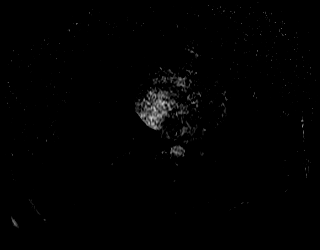

[Series 13: axial dynamic post · axial · 4.0mm · 1.19mm/px · z∈[-50,+202]mm · 3 of 64 slices shown (5 of 6)]
[im 1/64]
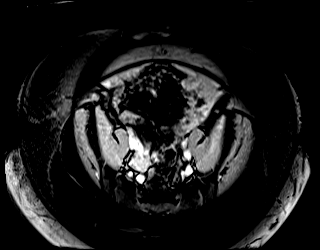
[im 32/64]
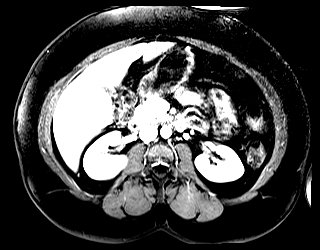
[im 64/64]
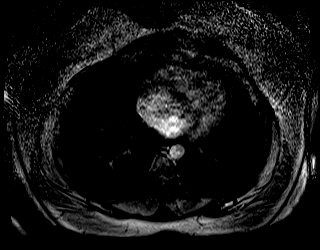

[Series 14: axial dynamic post · axial · 4.0mm · 1.19mm/px · z∈[-50,+202]mm · 3 of 64 slices shown (6 of 6)]
[im 1/64]
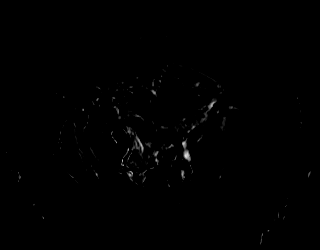
[im 32/64]
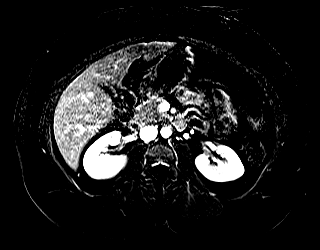
[im 64/64]
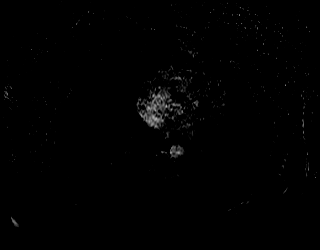

[Series 16: axial ssfse / · axial · 6.0mm · 1.19mm/px · z∈[-36,+188]mm · 2 of 32 slices shown]
[im 1/32]
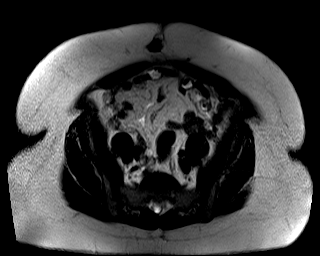
[im 32/32]
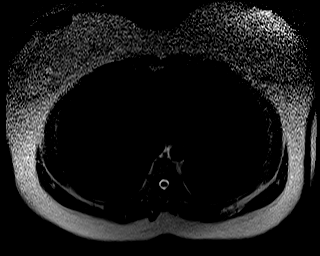

[Series 17: axial dynamic 3 · axial · 4.0mm · 1.19mm/px · z∈[-50,+202]mm · 3 of 64 slices shown (1 of 2)]
[im 1/64]
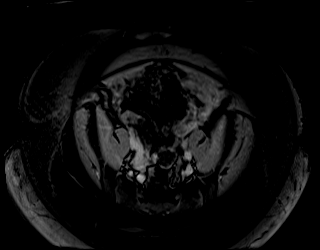
[im 32/64]
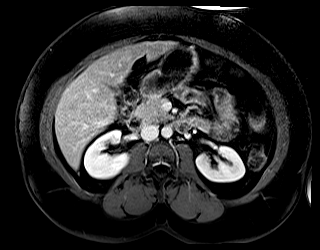
[im 64/64]
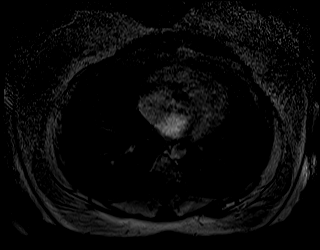

[Series 18: axial dynamic 3 · axial · 4.0mm · 1.19mm/px · z∈[-50,+202]mm · 3 of 64 slices shown (2 of 2)]
[im 1/64]
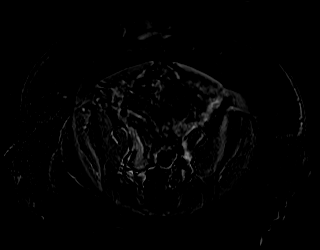
[im 32/64]
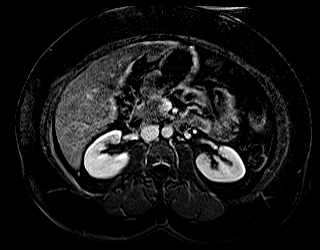
[im 64/64]
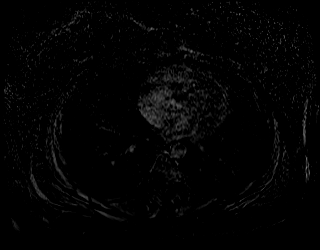

[43 of 48 positions shown; findings below may reference images not displayed]

FINDINGS: Lower chest: No acute abnormality at the lung bases.

Hepatobiliary: Normal liver size and configuration. Mild diffuse
hepatic steatosis. Sub 5 mm simple posterior right liver lobe cyst.
Otherwise no liver lesions. Normal gallbladder with no
cholelithiasis. No biliary ductal dilatation. Common bile duct
diameter 2 mm. No choledocholithiasis.

Pancreas: No pancreatic mass or duct dilation.  No pancreas divisum.

Spleen: Normal size. No mass.

Adrenals/Urinary Tract: Normal adrenals. No hydronephrosis. There
are a few tiny scattered simple renal cysts in both kidneys, largest
0.9 cm in the upper right kidney. There is a 1.0 cm T1 hyperintense
and T2 hypointense renal cortical lesion in anterior mid to lower
left kidney (series 6/image 35), which demonstrates questionable
hypoenhancement on postcontrast subtraction sequences, considered
Bosniak category IIF.

Stomach/Bowel: Grossly normal stomach. Visualized small and large
bowel is normal caliber, with no bowel wall thickening.

Vascular/Lymphatic: Normal caliber abdominal aorta. Patent portal,
splenic, hepatic and renal veins. No pathologically enlarged lymph
nodes in the abdomen.

Other: No abdominal ascites or focal fluid collection.

Musculoskeletal: No aggressive appearing focal osseous lesions.
IMPRESSION: 1. Small 1.0 cm hemorrhagic renal cortical lesion in the anterior
mid to lower left kidney with questionable hypoenhancement,
considered Bosniak category IIF. Follow-up MRI abdomen without and
with IV contrast is recommended in 6 months.
2. Mild diffuse hepatic steatosis.

## 2019-02-21 ENCOUNTER — Other Ambulatory Visit: Payer: Self-pay | Admitting: Physician Assistant

## 2019-02-21 DIAGNOSIS — Z1231 Encounter for screening mammogram for malignant neoplasm of breast: Secondary | ICD-10-CM

## 2019-04-05 ENCOUNTER — Ambulatory Visit
Admission: RE | Admit: 2019-04-05 | Discharge: 2019-04-05 | Disposition: A | Discharge status: Home / Self Care | Payer: BC Managed Care – PPO | Source: Ambulatory Visit | Attending: Physician Assistant | Admitting: Physician Assistant

## 2019-04-05 ENCOUNTER — Other Ambulatory Visit: Payer: Self-pay

## 2019-04-05 DIAGNOSIS — Z1231 Encounter for screening mammogram for malignant neoplasm of breast: Secondary | ICD-10-CM

## 2019-04-06 ENCOUNTER — Other Ambulatory Visit: Payer: Self-pay | Admitting: Physician Assistant

## 2019-04-06 ENCOUNTER — Ambulatory Visit: Payer: BLUE CROSS/BLUE SHIELD

## 2019-04-06 DIAGNOSIS — N644 Mastodynia: Secondary | ICD-10-CM

## 2019-04-11 ENCOUNTER — Ambulatory Visit
Admission: RE | Admit: 2019-04-11 | Discharge: 2019-04-11 | Disposition: A | Discharge status: Home / Self Care | Payer: BC Managed Care – PPO | Source: Ambulatory Visit | Attending: Physician Assistant | Admitting: Physician Assistant

## 2019-04-11 ENCOUNTER — Other Ambulatory Visit: Payer: Self-pay

## 2019-04-11 DIAGNOSIS — N644 Mastodynia: Secondary | ICD-10-CM

## 2020-04-07 ENCOUNTER — Ambulatory Visit (INDEPENDENT_AMBULATORY_CARE_PROVIDER_SITE_OTHER): Payer: BC Managed Care – PPO

## 2020-04-07 ENCOUNTER — Other Ambulatory Visit: Payer: Self-pay

## 2020-04-07 ENCOUNTER — Ambulatory Visit (INDEPENDENT_AMBULATORY_CARE_PROVIDER_SITE_OTHER): Payer: BC Managed Care – PPO | Admitting: Medical-Surgical

## 2020-04-07 ENCOUNTER — Encounter: Payer: Self-pay | Admitting: Medical-Surgical

## 2020-04-07 VITALS — BP 141/83 | HR 76 | Temp 98.2°F | Ht 62.0 in | Wt 199.0 lb

## 2020-04-07 DIAGNOSIS — R200 Anesthesia of skin: Secondary | ICD-10-CM

## 2020-04-07 DIAGNOSIS — M25552 Pain in left hip: Secondary | ICD-10-CM | POA: Diagnosis not present

## 2020-04-07 DIAGNOSIS — M545 Low back pain, unspecified: Secondary | ICD-10-CM

## 2020-04-07 DIAGNOSIS — Z1231 Encounter for screening mammogram for malignant neoplasm of breast: Secondary | ICD-10-CM | POA: Diagnosis not present

## 2020-04-07 MED ORDER — PREDNISONE 50 MG PO TABS: 50.0000 mg | ORAL_TABLET | Freq: Every day | ORAL | 0 refills | Status: DC

## 2020-04-07 MED ORDER — MELOXICAM 15 MG PO TABS: 7.5000 mg | ORAL_TABLET | Freq: Every day | ORAL | 0 refills | Status: DC

## 2020-04-07 NOTE — Progress Notes (Signed)
Subjective:    CC: left thigh numbness  HPI: Pleasant 44 year old female presenting with complaints of intermittent left thigh numbness over the past several months. Does have occasional pain in the area but feels the numbness is more of a problem. Today the numbness is on the lateral left thigh extending to the posterior knee and upper calf. There is no weakness of the muscles in the leg although it does often give way due to knee problems. Last night, she experienced significant pain in the left anterior hip at the inguinal crease and just above. She took 2 Excedrin which helped the pain but the numbness persisted. Known low back problems with sciatica. She has been seeing a chiropractor every other week, feels this has been helpful for her midback. Denies saddle paresthesias, loss of bowel/bladder control, calf pain, fever, chills, or recent injury.  I reviewed the past medical history, family history, social history, surgical history, and allergies today and no changes were needed.  Please see the problem list section below in epic for further details.  Past Medical History: Past Medical History:  Diagnosis Date  . Benign cyst of right breast 08/09/2017  . Fatty infiltration of liver 07/04/2017  . Hypertension   . Nephrolithiasis   . Obesity   . Prediabetes 06/19/2017   Past Surgical History: Past Surgical History:  Procedure Laterality Date  . ABDOMINAL HYSTERECTOMY    . TUBAL LIGATION     Social History: Social History   Socioeconomic History  . Marital status: Married    Spouse name: Not on file  . Number of children: Not on file  . Years of education: Not on file  . Highest education level: Not on file  Occupational History  . Not on file  Tobacco Use  . Smoking status: Never Smoker  . Smokeless tobacco: Never Used  Vaping Use  . Vaping Use: Never used  Substance and Sexual Activity  . Alcohol use: No  . Drug use: No  . Sexual activity: Yes    Partners: Male     Birth control/protection: Surgical  Other Topics Concern  . Not on file  Social History Narrative  . Not on file   Social Determinants of Health   Financial Resource Strain:   . Difficulty of Paying Living Expenses: Not on file  Food Insecurity:   . Worried About Charity fundraiser in the Last Year: Not on file  . Ran Out of Food in the Last Year: Not on file  Transportation Needs:   . Lack of Transportation (Medical): Not on file  . Lack of Transportation (Non-Medical): Not on file  Physical Activity:   . Days of Exercise per Week: Not on file  . Minutes of Exercise per Session: Not on file  Stress:   . Feeling of Stress : Not on file  Social Connections:   . Frequency of Communication with Friends and Family: Not on file  . Frequency of Social Gatherings with Friends and Family: Not on file  . Attends Religious Services: Not on file  . Active Member of Clubs or Organizations: Not on file  . Attends Archivist Meetings: Not on file  . Marital Status: Not on file   Family History: Family History  Problem Relation Age of Onset  . Hypertension Mother   . Gout Mother   . Hyperlipidemia Father   . Hypertension Brother   . Gout Brother   . Diabetes Maternal Aunt   . Renal Disease Maternal Aunt   .  Colon cancer Paternal Uncle   . Colon cancer Maternal Grandmother   . Colon cancer Paternal Uncle    Allergies: Allergies  Allergen Reactions  . Morphine And Related Anaphylaxis  . Latex Rash   Medications: See med rec.  Review of Systems: See HPI for pertinent positives and negatives.  Objective:    General: Well Developed, well nourished, and in no acute distress.  Neuro: Alert and oriented x3.  HEENT: Normocephalic, atraumatic.  Skin: Warm and dry. Cardiac: Regular rate and rhythm, no murmurs rubs or gallops, no lower extremity edema.  Respiratory: Clear to auscultation bilaterally. Not using accessory muscles, speaking in full sentences. MSK: BLE  strength 5/5, full ROM. Altered sensation to the left lateral thigh with mild tenderness to the left posterior knee. Bilateral 1+ pitting pedal edema. Negative straight leg raise. Positive FABER bilaterally. Significant difficulty with lying on exam table and MSK exam. Tenderness to the left anterior inguinal crease.   Impression and Recommendations:    1. Encounter for screening mammogram for malignant neoplasm of breast Mammogram due, order entered so patient can schedule.  - MM 3D SCREEN BREAST BILATERAL; Future  2. Left hip pain/left leg numbness Getting hip and lumbar spine x-rays. Suspect intermittent numbness and pain related to SI joint dysfunction. Exercise handout provided with instructions to complete 2-3 times daily. Prednisone 50mg  daily x 5 days then start Meloxicam daily. Discussed conservative treatment with heat, ice, and/or tylenol. Advised to avoid Ibuprofen containing products while taking Meloxicam.  - DG Hip Unilat W OR W/O Pelvis 2-3 Views Left; Future - DG Lumbar Spine Complete; Future  Return for follow up with Dr. Darene Lamer if symptoms no better in 2 weeks. ___________________________________________ Clearnce Sorrel, DNP, APRN, FNP-BC Primary Care and Sports Medicine [place]

## 2020-04-16 ENCOUNTER — Ambulatory Visit (INDEPENDENT_AMBULATORY_CARE_PROVIDER_SITE_OTHER): Payer: BC Managed Care – PPO

## 2020-04-16 ENCOUNTER — Other Ambulatory Visit: Payer: Self-pay

## 2020-04-16 DIAGNOSIS — Z1231 Encounter for screening mammogram for malignant neoplasm of breast: Secondary | ICD-10-CM

## 2020-04-21 ENCOUNTER — Ambulatory Visit (INDEPENDENT_AMBULATORY_CARE_PROVIDER_SITE_OTHER): Payer: BC Managed Care – PPO | Admitting: Sports Medicine

## 2020-04-21 ENCOUNTER — Other Ambulatory Visit: Payer: Self-pay

## 2020-04-21 ENCOUNTER — Encounter: Payer: Self-pay | Admitting: Sports Medicine

## 2020-04-21 DIAGNOSIS — M5416 Radiculopathy, lumbar region: Secondary | ICD-10-CM

## 2020-04-21 MED ORDER — GABAPENTIN 300 MG PO CAPS: ORAL_CAPSULE | ORAL | 3 refills | Status: DC

## 2020-04-21 NOTE — Progress Notes (Signed)
° ° °  Procedures performed today:    None.  Independent interpretation of notes and tests performed by another provider:   None.  Brief History, Exam, Impression, and Recommendations:    Left lumbar radiculitis This is a very pleasant 44 year old female, she is a long history of back pain, more recently she is had pain going down the back of her leg to the back of the calf. No red flags. She tried some prednisone which was helpful for 5 days, meloxicam as not cutting it at this point. She tried some home rehab exercises without sufficient improvement. We Splane the anatomy and pathophysiology of lumbar radiculitis, we will do formal PT, Neurontin, continue meloxicam, return to see me in 6 weeks, MRI for interventional planning if no better.    ___________________________________________ [name]. [name], M.D., ABFM., CAQSM. Primary Care and [place] Instructor of [place] of [facility]

## 2020-04-21 NOTE — Assessment & Plan Note (Signed)
This is a very pleasant 44 year old female, she is a long history of back pain, more recently she is had pain going down the back of her leg to the back of the calf. No red flags. She tried some prednisone which was helpful for 5 days, meloxicam as not cutting it at this point. She tried some home rehab exercises without sufficient improvement. We Splane the anatomy and pathophysiology of lumbar radiculitis, we will do formal PT, Neurontin, continue meloxicam, return to see me in 6 weeks, MRI for interventional planning if no better.

## 2020-04-29 ENCOUNTER — Other Ambulatory Visit: Payer: Self-pay

## 2020-04-29 ENCOUNTER — Encounter: Payer: Self-pay | Admitting: Rehabilitative and Restorative Service Providers"

## 2020-04-29 ENCOUNTER — Ambulatory Visit (INDEPENDENT_AMBULATORY_CARE_PROVIDER_SITE_OTHER): Payer: BC Managed Care – PPO | Admitting: Rehabilitative and Restorative Service Providers"

## 2020-04-29 DIAGNOSIS — R29898 Other symptoms and signs involving the musculoskeletal system: Secondary | ICD-10-CM | POA: Diagnosis not present

## 2020-04-29 DIAGNOSIS — M5416 Radiculopathy, lumbar region: Secondary | ICD-10-CM | POA: Diagnosis not present

## 2020-04-29 NOTE — Therapy (Signed)
[name], [place], [address] Phone: [phone]   Fax:  [phone]  Physical Therapy Evaluation  Patient Details  Name: Amber Mcmillan MRN: [id number] Date of Birth: 1976-02-08 Referring Provider (PT): Dr [name]   Encounter Date: 04/29/2020   PT End of Session - 04/29/20 1133    Visit Number 1    Number of Visits 12    Date for PT Re-Evaluation 06/10/20    PT Start Time 0846    PT Stop Time 0933    PT Time Calculation (min) 47 min    Activity Tolerance Patient tolerated treatment well           Past Medical History:  Diagnosis Date  . Benign cyst of right breast 08/09/2017  . Fatty infiltration of liver 07/04/2017  . Headache   . Hypertension   . Nephrolithiasis   . Obesity   . Prediabetes 06/19/2017    Past Surgical History:  Procedure Laterality Date  . ABDOMINAL HYSTERECTOMY    . TUBAL LIGATION      There were no vitals filed for this visit.    Subjective Assessment - 04/29/20 0851    Subjective Patient reports that she had sciatica when she was pregnant 20 yrs with intermittnet pain in Lt LB and posterior Lt leg. Symptoms have been flared up for ~ 1 month. she had some improvement with prednisone but numbness increased when meds stopped. She continues to have pain in the Lt LB abd posterior Lt thigh and at times to the Lt foot and heel.    Pertinent History chronic LBP with flare ups every 2 months treated with bed rest and OTC meds; hysterectomy 2014; children 18 and 26 vaginal deliveries; migraine HA's; arthritis    Patient Stated Goals get rid of pain and keep symptoms from coming back    Currently in Pain? Yes    Pain Score 3     Pain Location Back    Pain Orientation Left;Lower    Pain Descriptors / Indicators Dull    Pain Type Acute pain;Chronic pain    Pain Radiating Towards posterior thigh to knee    Pain Onset More than a month ago    Pain Frequency  Intermittent    Aggravating Factors  standing; sitting; does not know what irritates the symptoms    Pain Relieving Factors meds; changing positions - lying down              [facility] PT Assessment - 04/29/20 0001      Assessment   Medical Diagnosis Lumbar radiculitis; Lt LE pain     Referring Provider (PT) Dr [name]    Onset Date/Surgical Date 04/02/20    Hand Dominance Right    Next MD Visit 6 weeks not scheduled    Prior Therapy chiropractic care 3 x/wk for several week now every other week       Precautions   Precautions None      Restrictions   Weight Bearing Restrictions No      Balance Screen   Has the patient fallen in the past 6 months No    Has the patient had a decrease in activity level because of a fear of falling?  No    Is the patient reluctant to leave their home because of a fear of falling?  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       Observation/Other Assessments   Focus on Therapeutic Outcomes (FOTO)  49% limitation       Sensation   Additional Comments numbness lateral thigh and into leg; lateral foot and heel at times       Posture/Postural Control   Posture Comments head forward shoulders rounded and elevated; flexed at hips       AROM   Lumbar Flexion 75% pulling Lt LB and posterior Lt thigh    Lumbar Extension 50%     Lumbar - Right Side Bend 100% sore Rt LB    Lumbar - Left Side Bend 95%     Lumbar - Right Rotation 25% tight    Lumbar - Left Rotation 25% tight       Strength   Overall Strength Comments Lt hip extension 4+/5       Flexibility   Hamstrings tight Lt > Rt    Quadriceps tight Rt > Lt     ITB tight Lt > Rt    Piriformis tight Lt > Rt       Palpation   Spinal mobility hypomobile lumbar spine with pain  with PA mobs and Lt lateral mobs     Palpation comment muscular tightness Lt psoas; Lt lumbar paraspinals; glut min/med; piriformis       Special Tests   Other special tests (-) SLR      Ambulation/Gait   Gait Comments antalgic gait limp Lt LE; Lt LE in ER       Balance   Balance Assessed --   SLS ~ 10 sec bilat difficulty with balance on Lt                      Objective measurements completed on examination: See above findings.               PT Education - 04/29/20 0929    Education Details HEP POC posture    Person(s) Educated Patient    Methods Explanation;Demonstration;Tactile cues;Verbal cues;Handout    Comprehension Verbalized understanding;Returned demonstration;Verbal cues required;Tactile cues required               PT Long Term Goals - 04/29/20 1140      PT LONG TERM GOAL #1   Title Decrease frequency, intensity, duration of pain by 75-90% allowing patient to perform normal functional activities    Time 6    Period Weeks    Status New    Target Date 06/10/20      PT LONG TERM GOAL #2   Title Improve tissue extensibility and ROM through Lt hip and LE    Time 6    Period Weeks    Status New    Target Date 06/10/20      PT LONG TERM GOAL #3   Title Patient reports ability to move sit to stand and stand/walk for functional distances/time (20 min) with minimal to no increase in pain    Time 6    Period Weeks    Status New    Target Date 06/10/20      PT LONG TERM GOAL #4   Title Independent in HEP    Time 6    Period Weeks    Status New    Target Date 06/10/20  PT LONG TERM GOAL #5   Title Improve FOTO to </= 37% limitation    Time 6    Period Weeks    Status New    Target Date 06/10/20                  Plan - 04/29/20 1133    Clinical Impression Statement Patient presents with ~ 35month flare up of Lt lumbar to posterior Lt hip and thigh with symptoms into the leg and lateral foot to heel on an intermittent  basis. Patient has a history of Lt lower quarter symptoms since the birth of her 34 yr old son. She had flare ups of Lt LB and posterior LE pain every 2-3 months for the past years with symptoms treated with rest and OTC meds. She has a hostory of cervicalgia and headaches as well. Patient has poor posture and alignment with increased lumbar lordosis; decreased hip mobilty; poor core strength and stability; antalgic gait; limited functional activities; pain on a daily basis. Patient will benefit from PT to address probems identified.    Stability/Clinical Decision Making Stable/Uncomplicated    Clinical Decision Making Low    Rehab Potential Good    PT Frequency 2x / week    PT Duration 6 weeks    PT Treatment/Interventions ADLs/Self Care Home Management;Aquatic Therapy;Cryotherapy;Electrical Stimulation;Iontophoresis 4mg /ml Dexamethasone;Moist Heat;Ultrasound;Traction;Gait training;Stair training;Functional mobility training;Therapeutic activities;Therapeutic exercise;Balance training;Neuromuscular re-education;Patient/family education;Manual techniques;Dry needling;Taping    PT Next Visit Plan review HEP; discuss trial of DN; work on Economist; core stabilization and strengthening; modalities as indicated(pt has a TENS unit at home for neck - will try on hip and LB)    PT Home Exercise Plan [email or web address]    Consulted and Agree with Plan of Care Patient           Patient will benefit from skilled therapeutic intervention in order to improve the following deficits and impairments:     Visit Diagnosis: Left lumbar radiculitis - Plan: PT plan of care cert/re-cert  Other symptoms and signs involving the musculoskeletal system - Plan: PT plan of care cert/re-cert     Problem List Patient Active Problem List   Diagnosis Date Noted  . Left lumbar radiculitis 04/21/2020  . Abnormal peripheral vision of right eye 10/17/2017  . Elevated blood pressure reading 09/16/2017  . Class 2 obesity due  to excess calories without serious comorbidity in adult 09/16/2017  . Primary insomnia 09/16/2017  . Benign cyst of right breast 08/09/2017  . Fatty infiltration of liver 07/04/2017  . Renal mass, left 07/04/2017  . Family history of colon cancer 06/27/2017  . Other specified disorders of kidney and ureter 06/27/2017  . History of nephrolithiasis 06/27/2017  . Prediabetes 06/19/2017  . Hyperglycemia 06/17/2017  . History of abnormal mammogram 06/17/2017  . H/O hysterectomy for benign disease 06/17/2017  . Abnormal CT scan, kidney 06/17/2017    Angenette Daily Nilda Simmer PT, MPH  04/29/2020, 11:45 AM  [facility] [place], [place], [address] Phone: [phone]   Fax:  [phone]  Name: Earnestene Angello MRN: [id number] Date of Birth: 1976/03/10

## 2020-04-29 NOTE — Patient Instructions (Addendum)
  Access Code: [email or web address]: [email or web address]: 11/16/2021Prepared by: Baran Kuhrt HoltExercises  Supine Transversus Abdominis Bracing with Pelvic Floor Contraction - 2 x daily - 7 x weekly - 1 sets - 10 reps - 10sec hold  Hooklying Hamstring Stretch with Strap - 2 x daily - 7 x weekly - 1 sets - 3 reps - 30 sec hold  Supine ITB Stretch with Strap - 2 x daily - 7 x weekly - 1 sets - 3 reps - 30 sec hold  Supine Piriformis Stretch with Leg Straight - 2 x daily - 7 x weekly - 1 sets - 3 reps - 30 sec hold Patient Education  Corporate treasurer Posture

## 2020-05-01 ENCOUNTER — Encounter: Payer: Self-pay | Admitting: Rehabilitative and Restorative Service Providers"

## 2020-05-01 ENCOUNTER — Ambulatory Visit (INDEPENDENT_AMBULATORY_CARE_PROVIDER_SITE_OTHER): Payer: BC Managed Care – PPO | Admitting: Rehabilitative and Restorative Service Providers"

## 2020-05-01 ENCOUNTER — Other Ambulatory Visit: Payer: Self-pay

## 2020-05-01 DIAGNOSIS — R29898 Other symptoms and signs involving the musculoskeletal system: Secondary | ICD-10-CM | POA: Diagnosis not present

## 2020-05-01 DIAGNOSIS — M5416 Radiculopathy, lumbar region: Secondary | ICD-10-CM | POA: Diagnosis not present

## 2020-05-01 NOTE — Patient Instructions (Signed)

## 2020-05-01 NOTE — Therapy (Signed)
[place], [place], [address] Phone: [phone]   Fax:  [phone]  Physical Therapy Treatment  Patient Details  Name: Amber Mcmillan MRN: [id number] Date of Birth: 13-Feb-1976 Referring Provider (PT): Dr [name]   Encounter Date: 05/01/2020   PT End of Session - 05/01/20 0852    Visit Number 2    Number of Visits 12    Date for PT Re-Evaluation 06/10/20    PT Start Time 0849    PT Stop Time 0934   moist heat end of treatment   PT Time Calculation (min) 45 min    Activity Tolerance Patient tolerated treatment well           Past Medical History:  Diagnosis Date  . Benign cyst of right breast 08/09/2017  . Fatty infiltration of liver 07/04/2017  . Headache   . Hypertension   . Nephrolithiasis   . Obesity   . Prediabetes 06/19/2017    Past Surgical History:  Procedure Laterality Date  . ABDOMINAL HYSTERECTOMY    . TUBAL LIGATION      There were no vitals filed for this visit.   Subjective Assessment - 05/01/20 0852    Subjective Patient reports that she has had a flare up of Lt LE pain - she was going up the steps Tuesday night when her leg just flet like it was going to "give out" on her. She continues to have increased pain. Exercises are going okay.    Currently in Pain? Yes    Pain Score 2     Pain Location Calf    Pain Orientation Left;Lower    Pain Descriptors / Indicators Tingling    Pain Type Acute pain;Chronic pain    Pain Onset More than a month ago    Pain Frequency Intermittent                             [facility] Adult PT Treatment/Exercise - 05/01/20 0001      Therapeutic Activites    Therapeutic Activities Other Therapeutic Activities    Other Therapeutic Activities myofacial release obturator internus in sitting with coregous ball 1-2 min       Lumbar Exercises: Stretches   Piriformis Stretch Left;3 reps;30 seconds   supine travell       Lumbar Exercises: Aerobic   Nustep L5 x 5 min seat 6; UE's 9      Lumbar Exercises: Supine   AB Set Limitations transverse abdominial - core 10 sec x 10       Moist Heat Therapy   Number Minutes Moist Heat 10 Minutes    Moist Heat Location Lumbar Spine      Manual Therapy   Manual therapy comments pt prone     Joint Mobilization PA mobs Lt hip    Soft tissue mobilization deep tissue release Lt/Rt obtruator internus                   PT Education - 05/01/20 0930    Education Details DN    Person(s) Educated Patient    Methods Explanation;Handout    Comprehension Verbalized understanding               PT Long Term Goals - 04/29/20 1140      PT LONG TERM GOAL #1   Title Decrease frequency, intensity, duration of pain by 75-90% allowing patient to perform normal functional activities  Time 6    Period Weeks    Status New    Target Date 06/10/20      PT LONG TERM GOAL #2   Title Improve tissue extensibility and ROM through Lt hip and LE    Time 6    Period Weeks    Status New    Target Date 06/10/20      PT LONG TERM GOAL #3   Title Patient reports ability to move sit to stand and stand/walk for functional distances/time (20 min) with minimal to no increase in pain    Time 6    Period Weeks    Status New    Target Date 06/10/20      PT LONG TERM GOAL #4   Title Independent in HEP    Time 6    Period Weeks    Status New    Target Date 06/10/20      PT LONG TERM GOAL #5   Title Improve FOTO to </= 37% limitation    Time 6    Period Weeks    Status New    Target Date 06/10/20                 Plan - 05/01/20 0855    Clinical Impression Statement Persistent pain and radicular symptoms into the Lt calf. Significant tenderness and tightness Lt >> Rt obtruator internus; Lt lateral sacral border; piriformis and external rotators. Tight hip IR/ER bilat w/ pt in prone hip extended, knee flexed. Tolerated myofacial relesae with ball in sitting  fair. Symptoms improved with moist heat post treatment. Patient has longstanding pelvic and lumbar issues with poor core stability and strength. Will continue to assess pelvic and SI problems and possibly try DN for Lt piriformis/external rotators as scheduling allows.    Rehab Potential Good    PT Frequency 2x / week    PT Duration 6 weeks    PT Treatment/Interventions ADLs/Self Care Home Management;Aquatic Therapy;Cryotherapy;Electrical Stimulation;Iontophoresis 4mg /ml Dexamethasone;Moist Heat;Ultrasound;Traction;Gait training;Stair training;Functional mobility training;Therapeutic activities;Therapeutic exercise;Balance training;Neuromuscular re-education;Patient/family education;Manual techniques;Dry needling;Taping    PT Next Visit Plan review HEP; discuss trial of DN; work on Economist; core stabilization and strengthening; modalities as indicated(pt has a TENS unit at home for neck - will try on hip and LB) assess response to deep tissue work through the [place] and Agree with Plan of Care Patient           Patient will benefit from skilled therapeutic intervention in order to improve the following deficits and impairments:     Visit Diagnosis: Left lumbar radiculitis  Other symptoms and signs involving the musculoskeletal system     Problem List Patient Active Problem List   Diagnosis Date Noted  . Left lumbar radiculitis 04/21/2020  . Abnormal peripheral vision of right eye 10/17/2017  . Elevated blood pressure reading 09/16/2017  . Class 2 obesity due to excess calories without serious comorbidity in adult 09/16/2017  . Primary insomnia 09/16/2017  . Benign cyst of right breast 08/09/2017  . Fatty infiltration of liver 07/04/2017  . Renal mass, left 07/04/2017  . Family history of colon cancer 06/27/2017  . Other specified disorders of kidney and ureter 06/27/2017  . History of nephrolithiasis 06/27/2017  .  Prediabetes 06/19/2017  . Hyperglycemia 06/17/2017  . History of abnormal mammogram 06/17/2017  . H/O hysterectomy for benign disease 06/17/2017  . Abnormal CT scan, kidney 06/17/2017  [place], MPH  05/01/2020, 2:36 PM  [facility] [place], [place], [address] Phone: [phone]   Fax:  [phone]  Name: Amber Mcmillan MRN: [id number] Date of Birth: [birth year]

## 2020-05-06 ENCOUNTER — Other Ambulatory Visit: Payer: Self-pay

## 2020-05-06 ENCOUNTER — Ambulatory Visit (INDEPENDENT_AMBULATORY_CARE_PROVIDER_SITE_OTHER): Payer: BC Managed Care – PPO | Admitting: Physical Therapy

## 2020-05-06 DIAGNOSIS — M5416 Radiculopathy, lumbar region: Secondary | ICD-10-CM

## 2020-05-06 DIAGNOSIS — R29898 Other symptoms and signs involving the musculoskeletal system: Secondary | ICD-10-CM | POA: Diagnosis not present

## 2020-05-06 NOTE — Therapy (Signed)
[place], [place], [address] Phone: [phone]   Fax:  [phone]  Physical Therapy Treatment  Patient Details  Name: Amber Mcmillan MRN: [id number] Date of Birth: February 17, 1976 Referring Provider (PT): Dr [name]   Encounter Date: 05/06/2020   PT End of Session - 05/06/20 1044    Visit Number 3    Number of Visits 12    Date for PT Re-Evaluation 06/10/20    PT Start Time 1024   pt arrived late   PT Stop Time 1102    PT Time Calculation (min) 38 min    Activity Tolerance Patient tolerated treatment well    Behavior During Therapy [facility] for tasks assessed/performed           Past Medical History:  Diagnosis Date  . Benign cyst of right breast 08/09/2017  . Fatty infiltration of liver 07/04/2017  . Headache   . Hypertension   . Nephrolithiasis   . Obesity   . Prediabetes 06/19/2017    Past Surgical History:  Procedure Laterality Date  . ABDOMINAL HYSTERECTOMY    . TUBAL LIGATION      There were no vitals filed for this visit.   Subjective Assessment - 05/06/20 1026    Subjective Pt reports her numbness in LLE is intermittent.  She'll notice after standing and cooking in kitchen, her LLE will start to go numb all the way down to foot. She'll start pounding on leg to attempt to wake it up (without success). She is fearful her Lt knee will give out when ascending stairs.   Her Lt hip was sore after last session.    Patient Stated Goals get rid of pain and keep symptoms from coming back    Currently in Pain? No/denies    Pain Score 0-No pain              [facility] PT Assessment - 05/06/20 0001      Assessment   Medical Diagnosis Lumbar radiculitis; Lt LE pain     Referring Provider (PT) Dr [name]    Onset Date/Surgical Date 04/02/20    Hand Dominance Right    Next MD Visit 6 weeks not scheduled    Prior Therapy chiropractic care 3 x/wk for several week now every other  week            [facility] Adult PT Treatment/Exercise - 05/06/20 0001      Self-Care   Self-Care Other Self-Care Comments;Posture    Posture Pt educated regarding importance of upright posture at work station with lumbar support; pt shown how rolled pillow/towel can support low back      Other Self-Care Comments  pt educated on self release with ball to hip / LB musculature.  Pt returned demo with cues (in standing position)      Lumbar Exercises: Stretches   Hip Flexor Stretch Limitations verbally reviewed  with demo provided.     Standing Extension 1 rep;5 seconds    Prone on Elbows Stretch 3 reps;10 seconds   pt reported "twinge" at Lt SI joint once in position   Quad Stretch Left;2 reps;Right;1 rep;20 seconds   prone with strap   Piriformis Stretch Left;2 reps;Right;1 rep;30 seconds   modified pigeon pose on table      Lumbar Exercises: Seated   Sit to Stand --   8 reps, no arms; core engaged. Eccentric lowering with arms held forward in front of her.    Other Seated  Lumbar Exercises TA engagement x 3-5 sec x 3 reps (tactile cues for engagement). Trial of lap press x 5 sec      Manual Therapy   Manual therapy comments pt prone.   point tender along sacral border, into glute med and piriformis     Soft tissue mobilization STM to left post hip with passive IR/ER                        PT Long Term Goals - 04/29/20 1140      PT LONG TERM GOAL #1   Title Decrease frequency, intensity, duration of pain by 75-90% allowing patient to perform normal functional activities    Time 6    Period Weeks    Status New    Target Date 06/10/20      PT LONG TERM GOAL #2   Title Improve tissue extensibility and ROM through Lt hip and LE    Time 6    Period Weeks    Status New    Target Date 06/10/20      PT LONG TERM GOAL #3   Title Patient reports ability to move sit to stand and stand/walk for functional distances/time (20 min) with minimal to no increase in pain    Time 6      Period Weeks    Status New    Target Date 06/10/20      PT LONG TERM GOAL #4   Title Independent in HEP    Time 6    Period Weeks    Status New    Target Date 06/10/20      PT LONG TERM GOAL #5   Title Improve FOTO to </= 37% limitation    Time 6    Period Weeks    Status New    Target Date 06/10/20                 Plan - 05/06/20 1240    Clinical Impression Statement Pt demonstrated improved form with Sit to/from stand, with less pain, with cues for core engagement.  Persistant tightness in Lt post hip with manual therapy.  Pt reported increased pain near L5/S1 junction on Lt with prone on elbows stretch.  Pt interested in DN in future appt.  Pt may benefit from ball release to psoas. Encouraged pt to get up frequently from desk during day and utilize lumbar support at workstation.   Goals are ongoing.    Rehab Potential Good    PT Frequency 2x / week    PT Duration 6 weeks    PT Treatment/Interventions ADLs/Self Care Home Management;Aquatic Therapy;Cryotherapy;Electrical Stimulation;Iontophoresis 4mg /ml Dexamethasone;Moist Heat;Ultrasound;Traction;Gait training;Stair training;Functional mobility training;Therapeutic activities;Therapeutic exercise;Balance training;Neuromuscular re-education;Patient/family education;Manual techniques;Dry needling;Taping    PT Next Visit Plan review HEP; discuss trial of DN; work on Economist; core stabilization and strengthening; modalities as indicated(pt has a TENS unit at home for neck - will try on hip and LB) assess response to deep tissue work through the [place] and Agree with Plan of Care Patient           Patient will benefit from skilled therapeutic intervention in order to improve the following deficits and impairments:     Visit Diagnosis: Left lumbar radiculitis  Other symptoms and signs involving the musculoskeletal system     Problem List Patient Active Problem  List   Diagnosis Date Noted  .  Left lumbar radiculitis 04/21/2020  . Abnormal peripheral vision of right eye 10/17/2017  . Elevated blood pressure reading 09/16/2017  . Class 2 obesity due to excess calories without serious comorbidity in adult 09/16/2017  . Primary insomnia 09/16/2017  . Benign cyst of right breast 08/09/2017  . Fatty infiltration of liver 07/04/2017  . Renal mass, left 07/04/2017  . Family history of colon cancer 06/27/2017  . Other specified disorders of kidney and ureter 06/27/2017  . History of nephrolithiasis 06/27/2017  . Prediabetes 06/19/2017  . Hyperglycemia 06/17/2017  . History of abnormal mammogram 06/17/2017  . H/O hysterectomy for benign disease 06/17/2017  . Abnormal CT scan, kidney 06/17/2017   Kerin Perna, PTA 05/06/20 12:43 PM  [place], [place], [address] Phone: [phone]   Fax:  [phone]  Name: Amber Mcmillan MRN: [id number] Date of Birth: [birth year]

## 2020-05-06 NOTE — Patient Instructions (Addendum)
  Access Code: [email or web address]: [email or web address]: 11/23/2021Prepared by: [place] Hamstring Stretch with Strap - 2 x daily - 7 x weekly - 1 sets - 3 reps - 30 sec hold  Supine ITB Stretch with Strap - 2 x daily - 7 x weekly - 1 sets - 3 reps - 30 sec hold  Supine Piriformis Stretch with Leg Straight - 2 x daily - 7 x weekly - 1 sets - 3 reps - 30 sec hold  Sit to Stand without Arm Support - 2 x daily - 7 x weekly - 1 sets - 5 reps  Seated Transversus Abdominis Bracing - 1 x daily - 7 x weekly - 3 sets - 10 reps

## 2020-05-13 ENCOUNTER — Other Ambulatory Visit: Payer: Self-pay

## 2020-05-13 ENCOUNTER — Ambulatory Visit (INDEPENDENT_AMBULATORY_CARE_PROVIDER_SITE_OTHER): Payer: BC Managed Care – PPO | Admitting: Physical Therapy

## 2020-05-13 DIAGNOSIS — R29898 Other symptoms and signs involving the musculoskeletal system: Secondary | ICD-10-CM

## 2020-05-13 DIAGNOSIS — M5416 Radiculopathy, lumbar region: Secondary | ICD-10-CM | POA: Diagnosis not present

## 2020-05-13 NOTE — Therapy (Signed)
[place], [place], [address] Phone: [phone]   Fax:  [phone]  Physical Therapy Treatment  Patient Details  Name: Amber Mcmillan MRN: [id number] Date of Birth: Feb 21, 1976 Referring Provider (PT): Dr [name]   Encounter Date: 05/13/2020   PT End of Session - 05/13/20 1055    Visit Number 4    Number of Visits 12    Date for PT Re-Evaluation 06/10/20    PT Start Time 1021    PT Stop Time 1059    PT Time Calculation (min) 38 min    Activity Tolerance Patient tolerated treatment well    Behavior During Therapy [facility] for tasks assessed/performed           Past Medical History:  Diagnosis Date  . Benign cyst of right breast 08/09/2017  . Fatty infiltration of liver 07/04/2017  . Headache   . Hypertension   . Nephrolithiasis   . Obesity   . Prediabetes 06/19/2017    Past Surgical History:  Procedure Laterality Date  . ABDOMINAL HYSTERECTOMY    . TUBAL LIGATION      There were no vitals filed for this visit.   Subjective Assessment - 05/13/20 1023    Subjective Pt reports she was tight Thurs/Friday but low back has been ok. Took it easy over the holiday; avoided long periods of standing.  Stretching 1x/day.  Stairs have not been as challenging with Lt knee, "occasional twinging".    Patient Stated Goals get rid of pain and keep symptoms from coming back    Currently in Pain? No/denies    Pain Score 0-No pain              [facility] PT Assessment - 05/13/20 0001      Assessment   Medical Diagnosis Lumbar radiculitis; Lt LE pain     Referring Provider (PT) Dr [name]    Onset Date/Surgical Date 04/02/20    Hand Dominance Right    Next MD Visit 6 weeks not scheduled    Prior Therapy chiropractic care 3 x/wk for several week now every other week             [facility] Adult PT Treatment/Exercise - 05/13/20 0001      Self-Care   Other Self-Care Comments  pt educated on  self release with ball to Lt ant hip, reviewed release work to post Lt hip (against wall in standing)      Lumbar Exercises: Stretches   Passive Hamstring Stretch Left;Right;2 reps;20 seconds   supine with strap   Quad Stretch Left;2 reps;Right;1 rep;20 seconds   prone with strap   ITB Stretch Left;1 rep;20 seconds    Piriformis Stretch Left;2 reps;20 seconds   Travell in supine   Piriformis Stretch Limitations and 2 reps of modified pigeon pose on table LLE, 1 rep on RLE.     Other Lumbar Stretch Exercise single leg butterfly with LLE x 20 sec x 2      Lumbar Exercises: Aerobic   Other Aerobic Exercise single laps around gym during session to assess response to exercises.       Lumbar Exercises: Seated   Sit to Stand 10 reps   no arms; core engaged, eccentric lowering   Other Seated Lumbar Exercises reviewed sit to/from supine with core engaged; pt returned demo with minimal cues.                PT Education - 05/13/20 1103  Education Details pt issued samples of biofreeze/ Rock sauce    Person(s) Educated Patient    Methods Explanation    Comprehension Verbalized understanding               PT Long Term Goals - 05/13/20 1047      PT LONG TERM GOAL #1   Title Decrease frequency, intensity, duration of pain by 75-90% allowing patient to perform normal functional activities    Time 6    Period Weeks    Status On-going      PT LONG TERM GOAL #2   Title Improve tissue extensibility and ROM through Lt hip and LE    Time 6    Period Weeks    Status On-going      PT LONG TERM GOAL #3   Title Patient reports ability to move sit to stand and stand/walk for functional distances/time (20 min) with minimal to no increase in pain    Time 6    Period Weeks    Status On-going      PT LONG TERM GOAL #4   Title Independent in HEP    Time 6    Period Weeks    Status On-going      PT LONG TERM GOAL #5   Title Improve FOTO to </= 37% limitation    Time 6    Period  Weeks    Status On-going                 Plan - 05/13/20 1043    Clinical Impression Statement Pt has had positive response to stretches and core engagement with transitions; reporting significant reduction in pain over last 3 days.  Continued tightness in Lt ant/post hip.  Good release to iliopsoas with ball in prone, however it increased discomfort in Lt SI afterwards.  Pain in Lt glute/SI reduced with self massage with ball with cues.  Encouraged pt to utilize her TENS and ball for self care as needed to help with pain management/ tightness and to continue HEP of stretches.    Rehab Potential Good    PT Frequency 2x / week    PT Duration 6 weeks    PT Treatment/Interventions ADLs/Self Care Home Management;Aquatic Therapy;Cryotherapy;Electrical Stimulation;Iontophoresis 4mg /ml Dexamethasone;Moist Heat;Ultrasound;Traction;Gait training;Stair training;Functional mobility training;Therapeutic activities;Therapeutic exercise;Balance training;Neuromuscular re-education;Patient/family education;Manual techniques;Dry needling;Taping    PT Next Visit Plan continued work on [employer]; core stabilization and strengthening; possbile deep tissue work through the [employer] and Agree with Plan of Care Patient           Patient will benefit from skilled therapeutic intervention in order to improve the following deficits and impairments:     Visit Diagnosis: Left lumbar radiculitis  Other symptoms and signs involving the musculoskeletal system     Problem List Patient Active Problem List   Diagnosis Date Noted  . Left lumbar radiculitis 04/21/2020  . Abnormal peripheral vision of right eye 10/17/2017  . Elevated blood pressure reading 09/16/2017  . Class 2 obesity due to excess calories without serious comorbidity in adult 09/16/2017  . Primary insomnia 09/16/2017  . Benign cyst of right breast 08/09/2017  . Fatty infiltration of liver  07/04/2017  . Renal mass, left 07/04/2017  . Family history of colon cancer 06/27/2017  . Other specified disorders of kidney and ureter 06/27/2017  . History of nephrolithiasis 06/27/2017  . Prediabetes 06/19/2017  . Hyperglycemia 06/17/2017  . History  of abnormal mammogram 06/17/2017  . H/O hysterectomy for benign disease 06/17/2017  . Abnormal CT scan, kidney 06/17/2017   Kerin Perna, PTA 05/13/20 11:06 AM  [place], [place], [address] Phone: [phone]   Fax:  [phone]  Name: Amber Mcmillan MRN: [id number] Date of Birth: 12-Oct-1975

## 2020-05-15 ENCOUNTER — Encounter: Payer: BC Managed Care – PPO | Admitting: Physical Therapy

## 2020-05-20 ENCOUNTER — Encounter: Payer: BC Managed Care – PPO | Admitting: Rehabilitative and Restorative Service Providers"

## 2020-05-22 ENCOUNTER — Other Ambulatory Visit: Payer: Self-pay

## 2020-05-22 ENCOUNTER — Ambulatory Visit (INDEPENDENT_AMBULATORY_CARE_PROVIDER_SITE_OTHER): Payer: BC Managed Care – PPO | Admitting: Physical Therapy

## 2020-05-22 DIAGNOSIS — R29898 Other symptoms and signs involving the musculoskeletal system: Secondary | ICD-10-CM | POA: Diagnosis not present

## 2020-05-22 DIAGNOSIS — M5416 Radiculopathy, lumbar region: Secondary | ICD-10-CM

## 2020-05-22 NOTE — Therapy (Signed)
[place], [place], [address] Phone: [phone]   Fax:  [phone]  Physical Therapy Treatment  Patient Details  Name: Amber Mcmillan MRN: [id number] Date of Birth: 27-Oct-1975 Referring Provider (PT): Dr [name]   Encounter Date: 05/22/2020   PT End of Session - 05/22/20 1042    Visit Number 5    Number of Visits 12    Date for PT Re-Evaluation 06/10/20    PT Start Time 0944    PT Stop Time 1017    PT Time Calculation (min) 33 min    Behavior During Therapy [facility] for tasks assessed/performed           Past Medical History:  Diagnosis Date  . Benign cyst of right breast 08/09/2017  . Fatty infiltration of liver 07/04/2017  . Headache   . Hypertension   . Nephrolithiasis   . Obesity   . Prediabetes 06/19/2017    Past Surgical History:  Procedure Laterality Date  . ABDOMINAL HYSTERECTOMY    . TUBAL LIGATION      There were no vitals filed for this visit.   Subjective Assessment - 05/22/20 0944    Subjective Pt reports no pain, but numbness in Lt posterior thigh. It is intermittent; provoked with prolonged standing.   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    Patient Stated Goals get rid of pain and keep symptoms from coming back    Currently in Pain? No/denies    Pain Score 0-No pain    Pain Location Leg    Pain Orientation Left;Posterior;Lateral    Pain Descriptors / Indicators Numbness   lateral prox hamstring to knee   Pain Frequency Intermittent    Pain Relieving Factors ?              [facility] PT Assessment - 05/22/20 0001      Assessment   Medical Diagnosis Lumbar radiculitis; Lt LE pain     Referring Provider (PT) Dr [name]    Onset Date/Surgical Date 04/02/20    Hand Dominance Right    Next MD Visit 6 weeks not scheduled    Prior Therapy chiropractic care 3 x/wk for several week now every other week             [facility] Adult PT Treatment/Exercise - 05/22/20 0001      Lumbar Exercises: Stretches   Passive Hamstring Stretch Left;2 reps;30 seconds   hooklying with strap   Passive Hamstring Stretch Limitations limited tolerance; some increase in tingling in ankle.    Standing Extension 1 rep;5 seconds    Prone on Elbows Stretch 3 reps;20 seconds   no episodes of LBP with this exercise   Quad Stretch Left;3 reps;30 seconds   prone with strap   Other Lumbar Stretch Exercise Lt neural glides with flex/ext of knee with hip flexed >90 deg x 10 reps, 2 sets      Lumbar Exercises: Aerobic   Other Aerobic Exercise single laps around gym during session to assess response to exercises.       Lumbar Exercises: Supine   Bridge 5 reps   3 sec hold; core engaged. no increase in symptoms.     Manual Therapy   Manual Therapy Myofascial release;Soft tissue mobilization    Manual therapy comments pt point tender in mid Lt biceps femoris (area of tightness)    Soft tissue mobilization STM to Lt lateral hamstring, piriformis, glute  max/med.  TPR to Lt iliacus    Myofascial Release MFR to Lt iliopsoas and ant hip.                       PT Long Term Goals - 05/22/20 1038      PT LONG TERM GOAL #1   Title Decrease frequency, intensity, duration of pain by 75-90% allowing patient to perform normal functional activities    Baseline No longer has pain, but has numbness frequently.    Time 6    Period Weeks    Status Partially Met      PT LONG TERM GOAL #2   Title Improve tissue extensibility and ROM through Lt hip and LE    Time 6    Period Weeks    Status On-going      PT LONG TERM GOAL #3   Title Patient reports ability to move sit to stand and stand/walk for functional distances/time (20 min) with minimal to no increase in pain    Time 6    Period Weeks    Status Partially Met      PT LONG TERM GOAL #4   Title Independent in HEP    Time 6    Period Weeks    Status On-going      PT  LONG TERM GOAL #5   Title Improve FOTO to </= 37% limitation    Time 6    Period Weeks    Status On-going                 Plan - 05/22/20 1028    Clinical Impression Statement Pt no longer experiencing pain, but has intermetting numbness in LLE.  During session, numbness increased to top of Lt foot and fifth digit with STM to Lt piriformis, glute med and bicep femoris in prone position, and tingling into Lt shin with MFR/TPR to iliacus; tingling reduced in lower leg with time and stopping manual attempts.  Pt no longer has pain in LB with transitional movements now that she is engaging core.  Pt making gradual progress towards LTGs.    Rehab Potential Good    PT Frequency 2x / week    PT Duration 6 weeks    PT Treatment/Interventions ADLs/Self Care Home Management;Aquatic Therapy;Cryotherapy;Electrical Stimulation;Iontophoresis 54m/ml Dexamethasone;Moist Heat;Ultrasound;Traction;Gait training;Stair training;Functional mobility training;Therapeutic activities;Therapeutic exercise;Balance training;Neuromuscular re-education;Patient/family education;Manual techniques;Dry needling;Taping    PT Next Visit Plan core stabilization and strengthening; possibly traction or DN trial.    PT Home Exercise Plan [id number]    Consulted and Agree with Plan of Care Patient           Patient will benefit from skilled therapeutic intervention in order to improve the following deficits and impairments:     Visit Diagnosis: Left lumbar radiculitis  Other symptoms and signs involving the musculoskeletal system     Problem List Patient Active Problem List   Diagnosis Date Noted  . Left lumbar radiculitis 04/21/2020  . Abnormal peripheral vision of right eye 10/17/2017  . Elevated blood pressure reading 09/16/2017  . Class 2 obesity due to excess calories without serious comorbidity in adult 09/16/2017  . Primary insomnia 09/16/2017  . Benign cyst of right breast 08/09/2017  . Fatty  infiltration of liver 07/04/2017  . Renal mass, left 07/04/2017  . Family history of colon cancer 06/27/2017  . Other specified disorders of kidney and ureter 06/27/2017  . History of nephrolithiasis 06/27/2017  . Prediabetes 06/19/2017  . Hyperglycemia  06/17/2017  . History of abnormal mammogram 06/17/2017  . H/O hysterectomy for benign disease 06/17/2017  . Abnormal CT scan, kidney 06/17/2017   Kerin Perna, PTA 05/22/20 10:45 AM  [place], [place], [address] Phone: [phone]   Fax:  [phone]  Name: Amber Mcmillan MRN: [id number] Date of Birth: Jan 13, 1976

## 2020-05-26 ENCOUNTER — Other Ambulatory Visit: Payer: Self-pay

## 2020-05-26 ENCOUNTER — Encounter: Payer: Self-pay | Admitting: Physical Therapy

## 2020-05-26 ENCOUNTER — Ambulatory Visit (INDEPENDENT_AMBULATORY_CARE_PROVIDER_SITE_OTHER): Payer: BC Managed Care – PPO | Admitting: Physical Therapy

## 2020-05-26 DIAGNOSIS — R29898 Other symptoms and signs involving the musculoskeletal system: Secondary | ICD-10-CM

## 2020-05-26 DIAGNOSIS — M5416 Radiculopathy, lumbar region: Secondary | ICD-10-CM | POA: Diagnosis not present

## 2020-05-26 NOTE — Therapy (Signed)
[place], [place], [address] Phone: [phone]   Fax:  [phone]  Physical Therapy Treatment  Patient Details  Name: Amber Mcmillan MRN: [id number] Date of Birth: [birth year] Referring Provider (PT): Dr [name]   Encounter Date: 05/26/2020   PT End of Session - 05/26/20 0848    Visit Number 6    Number of Visits 12    Date for PT Re-Evaluation 06/10/20    PT Start Time 0848    PT Stop Time 0938    PT Time Calculation (min) 50 min    Activity Tolerance Patient tolerated treatment well    Behavior During Therapy [facility] for tasks assessed/performed           Past Medical History:  Diagnosis Date  . Benign cyst of right breast 08/09/2017  . Fatty infiltration of liver 07/04/2017  . Headache   . Hypertension   . Nephrolithiasis   . Obesity   . Prediabetes 06/19/2017    Past Surgical History:  Procedure Laterality Date  . ABDOMINAL HYSTERECTOMY    . TUBAL LIGATION      There were no vitals filed for this visit.   Subjective Assessment - 05/26/20 0848    Subjective Pt continues to report no pain, but numbness in Lt posterior thigh. It is intermittent; provoked with prolonged standing.    Pertinent History chronic LBP with flare ups every 2 months treated with bed rest and OTC meds; hysterectomy 2014; children 18 and 26 vaginal deliveries; migraine HA's; arthritis    Patient Stated Goals get rid of pain and keep symptoms from coming back    Currently in Pain? No/denies                             [facility] Adult PT Treatment/Exercise - 05/26/20 0001      Self-Care   Self-Care Other Self-Care Comments    Other Self-Care Comments  pt educated on DN and aftercare      Lumbar Exercises: Supine   Other Supine Lumbar Exercises thoracic ext over foam roller and rolling for STM      Lumbar Exercises: Sidelying   Other Sidelying Lumbar Exercises positioning left side  over pillow      Modalities   Modalities Moist Heat      Moist Heat Therapy   Number Minutes Moist Heat 10 Minutes    Moist Heat Location Lumbar Spine;Hip      Manual Therapy   Manual Therapy Soft tissue mobilization;Joint mobilization    Manual therapy comments Skilled palpation and monitoring of soft tissues during DN    Joint Mobilization bil lumbar L3-L5/S1    Soft tissue mobilization to bil lumbar and left gluteals/piriformis            Trigger Point Dry Needling - 05/26/20 0001    Consent Given? Yes    Education Handout Provided Yes    Muscles Treated Back/Hip Gluteus minimus;Gluteus medius;Gluteus maximus;Piriformis;Lumbar multifidi    Dry Needling Comments bil lumbar; left hip    Gluteus Minimus Response Twitch response elicited;Palpable increased muscle length    Gluteus Medius Response Twitch response elicited;Palpable increased muscle length    Gluteus Maximus Response Twitch response elicited;Palpable increased muscle length    Piriformis Response Palpable increased muscle length    Lumbar multifidi Response Twitch response elicited;Palpable increased muscle length  PT Education - 05/26/20 0928    Education Details DN Education and aftercare    Person(s) Educated Patient    Methods Explanation;Handout    Comprehension Verbalized understanding               PT Long Term Goals - 05/22/20 1038      PT LONG TERM GOAL #1   Title Decrease frequency, intensity, duration of pain by 75-90% allowing patient to perform normal functional activities    Baseline No longer has pain, but has numbness frequently.    Time 6    Period Weeks    Status Partially Met      PT LONG TERM GOAL #2   Title Improve tissue extensibility and ROM through Lt hip and LE    Time 6    Period Weeks    Status On-going      PT LONG TERM GOAL #3   Title Patient reports ability to move sit to stand and stand/walk for functional distances/time (20 min) with minimal  to no increase in pain    Time 6    Period Weeks    Status Partially Met      PT LONG TERM GOAL #4   Title Independent in HEP    Time 6    Period Weeks    Status On-going      PT LONG TERM GOAL #5   Title Improve FOTO to </= 37% limitation    Time 6    Period Weeks    Status On-going                 Plan - 05/26/20 0935    Clinical Impression Statement Patient presents with continued c/o numbness in her left posterior thigh. She is very tender to the touch and hypersensitive to palpation in bil lumbar and left gluteals. She responded very well to DN and STM in these areas with significant decease in tissue tension. Her right lumbar is still very tight and may benefit from further DN here.    PT Treatment/Interventions ADLs/Self Care Home Management;Aquatic Therapy;Cryotherapy;Electrical Stimulation;Iontophoresis 60m/ml Dexamethasone;Moist Heat;Ultrasound;Traction;Gait training;Stair training;Functional mobility training;Therapeutic activities;Therapeutic exercise;Balance training;Neuromuscular re-education;Patient/family education;Manual techniques;Dry needling;Taping    PT Next Visit Plan Assess DN and continue possibly to Rt QL. core stabilization and strengthening; possibly traction.    PT [place]          Patient will benefit from skilled therapeutic intervention in order to improve the following deficits and impairments:     Visit Diagnosis: Left lumbar radiculitis  Other symptoms and signs involving the musculoskeletal system     Problem List Patient Active Problem List   Diagnosis Date Noted  . Left lumbar radiculitis 04/21/2020  . Abnormal peripheral vision of right eye 10/17/2017  . Elevated blood pressure reading 09/16/2017  . Class 2 obesity due to excess calories without serious comorbidity in adult 09/16/2017  . Primary insomnia 09/16/2017  . Benign cyst of right breast 08/09/2017  . Fatty infiltration of liver 07/04/2017  .  Renal mass, left 07/04/2017  . Family history of colon cancer 06/27/2017  . Other specified disorders of kidney and ureter 06/27/2017  . History of nephrolithiasis 06/27/2017  . Prediabetes 06/19/2017  . Hyperglycemia 06/17/2017  . History of abnormal mammogram 06/17/2017  . H/O hysterectomy for benign disease 06/17/2017  . Abnormal CT scan, kidney 06/17/2017   JMadelyn FlavorsPT 05/26/2020, 1:17 PM  [address]  [place], [address] Phone: [phone]   Fax:  [phone]  Name: Kamoni Depree MRN: [id number] Date of Birth: 01/07/1976

## 2020-05-26 NOTE — Patient Instructions (Signed)

## 2020-05-28 ENCOUNTER — Ambulatory Visit (INDEPENDENT_AMBULATORY_CARE_PROVIDER_SITE_OTHER): Payer: BC Managed Care – PPO | Admitting: Rehabilitative and Restorative Service Providers"

## 2020-05-28 ENCOUNTER — Other Ambulatory Visit: Payer: Self-pay

## 2020-05-28 ENCOUNTER — Encounter: Payer: Self-pay | Admitting: Rehabilitative and Restorative Service Providers"

## 2020-05-28 DIAGNOSIS — M5416 Radiculopathy, lumbar region: Secondary | ICD-10-CM

## 2020-05-28 DIAGNOSIS — R29898 Other symptoms and signs involving the musculoskeletal system: Secondary | ICD-10-CM

## 2020-05-28 NOTE — Therapy (Signed)
[place], [place], [address] Phone: [phone]   Fax:  [phone]  Physical Therapy Treatment  Patient Details  Name: Amber Mcmillan MRN: [id number] Date of Birth: [birth year] Referring Provider (PT): Dr [name]   Encounter Date: 05/28/2020   PT End of Session - 05/28/20 0922    Visit Number 7    Number of Visits 12    Date for PT Re-Evaluation 06/10/20    PT Start Time 0852    PT Stop Time 0938    PT Time Calculation (min) 46 min    Activity Tolerance Patient tolerated treatment well    Behavior During Therapy [facility] for tasks assessed/performed           Past Medical History:  Diagnosis Date   Benign cyst of right breast 08/09/2017   Fatty infiltration of liver 07/04/2017   Headache    Hypertension    Nephrolithiasis    Obesity    Prediabetes 06/19/2017    Past Surgical History:  Procedure Laterality Date   ABDOMINAL HYSTERECTOMY     TUBAL LIGATION      There were no vitals filed for this visit.   Subjective Assessment - 05/28/20 0851    Subjective The patient reports pain is improved, but numbness continues in the posterior thigh.  Worse with prolonged standing.  The patient has soreness for 2 days from dry needling.    Pertinent History chronic LBP with flare ups every 2 months treated with bed rest and OTC meds; hysterectomy 2014; children 18 and 26 vaginal deliveries; migraine HA's; arthritis    Patient Stated Goals get rid of pain and keep symptoms from coming back    Currently in Pain? No/denies    Pain Orientation Left;Posterior    Pain Descriptors / Indicators Numbness    Pain Type Chronic pain;Acute pain    Pain Onset More than a month ago    Pain Frequency Intermittent    Aggravating Factors  standing, sitting    Pain Relieving Factors unsure              [facility] PT Assessment - 05/28/20 0857      Assessment   Medical Diagnosis Lumbar  radiculitis; Lt LE pain     Referring Provider (PT) Dr [name]    Onset Date/Surgical Date 04/02/20                         [facility] Adult PT Treatment/Exercise - 05/28/20 0900      Exercises   Exercises Lumbar      Lumbar Exercises: Stretches   Press Ups 5 reps;5 seconds    Quadruped Mid Back Stretch 1 rep;30 seconds    Piriformis Stretch Left;20 seconds;Right;1 rep    Other Lumbar Stretch Exercise standing QL stretch in door frame (feels in lats)      Lumbar Exercises: Aerobic   Tread Mill 3 minutes 2.0 mph      Lumbar Exercises: Sidelying   Other Sidelying Lumbar Exercises side plank x 3 reps R and L      Lumbar Exercises: Prone   Other Prone Lumbar Exercises plank on elbows x 5 reps x 5 sets      Lumbar Exercises: Quadruped   [place]cat/Old Horse 5 reps    Other Quadruped Lumbar Exercises quadriped rocking into heel sitting for stretch      Modalities   Modalities Traction      Traction  Type of Traction Lumbar    Min (lbs) 35    Max (lbs) 50    Hold Time 60    Rest Time 12    Time 15 minutes                  PT Education - 05/28/20 0922    Education Details HEP progression focusing on adding strengthening    Person(s) Educated Patient    Methods Explanation;Demonstration;Handout    Comprehension Verbalized understanding;Returned demonstration               PT Long Term Goals - 05/22/20 1038      PT LONG TERM GOAL #1   Title Decrease frequency, intensity, duration of pain by 75-90% allowing patient to perform normal functional activities    Baseline No longer has pain, but has numbness frequently.    Time 6    Period Weeks    Status Partially Met      PT LONG TERM GOAL #2   Title Improve tissue extensibility and ROM through Lt hip and LE    Time 6    Period Weeks    Status On-going      PT LONG TERM GOAL #3   Title Patient reports ability to move sit to stand and stand/walk for functional distances/time (20 min) with  minimal to no increase in pain    Time 6    Period Weeks    Status Partially Met      PT LONG TERM GOAL #4   Title Independent in HEP    Time 6    Period Weeks    Status On-going      PT LONG TERM GOAL #5   Title Improve FOTO to </= 37% limitation    Time 6    Period Weeks    Status On-going                 Plan - 05/28/20 7253    Clinical Impression Statement The patient continues with L posterior thigh numbness.  PT trialed traction for lumbar spine and patient tolerated well with centralization of symptoms to the gluts.    She gets some centralization of symptoms with supine with knees flexed to 90 degrees supported on physioball.  Plan to continue with traction if tolerated well- will reassess symptoms.    PT Treatment/Interventions ADLs/Self Care Home Management;Aquatic Therapy;Cryotherapy;Electrical Stimulation;Iontophoresis 70m/ml Dexamethasone;Moist Heat;Ultrasound;Traction;Gait training;Stair training;Functional mobility training;Therapeutic activities;Therapeutic exercise;Balance training;Neuromuscular re-education;Patient/family education;Manual techniques;Dry needling;Taping    PT Next Visit Plan use DN and continue possibly to Rt QL as needed; core stabilization and strengthening; continue traction    PT Home Exercise Plan [place] Agree with Plan of Care Patient           Patient will benefit from skilled therapeutic intervention in order to improve the following deficits and impairments:     Visit Diagnosis: Left lumbar radiculitis  Other symptoms and signs involving the musculoskeletal system     Problem List Patient Active Problem List   Diagnosis Date Noted   Left lumbar radiculitis 04/21/2020   Abnormal peripheral vision of right eye 10/17/2017   Elevated blood pressure reading 09/16/2017   Class 2 obesity due to excess calories without serious comorbidity in adult 09/16/2017   Primary insomnia 09/16/2017   Benign cyst  of right breast 08/09/2017   Fatty infiltration of liver 07/04/2017   Renal mass, left 07/04/2017   Family history of colon cancer 06/27/2017  Other specified disorders of kidney and ureter 06/27/2017   History of nephrolithiasis 06/27/2017   Prediabetes 06/19/2017   Hyperglycemia 06/17/2017   History of abnormal mammogram 06/17/2017   H/O hysterectomy for benign disease 06/17/2017   Abnormal CT scan, kidney 06/17/2017    Conor Lata, PT 05/28/2020, 11:18 AM  [facility] [place], [place], [address] Phone: [phone]   Fax:  [phone]  Name: Taiwan Millon MRN: [id number] Date of Birth: 1975/12/03

## 2020-05-28 NOTE — Patient Instructions (Signed)
Access Code: [email or web address] URL: [email or web address] Date: 05/28/2020 Prepared by: Rudell Cobb  Exercises Hooklying Hamstring Stretch with Strap - 2 x daily - 7 x weekly - 1 sets - 3 reps - 30 sec hold Supine ITB Stretch with Strap - 2 x daily - 7 x weekly - 1 sets - 3 reps - 30 sec hold Supine Piriformis Stretch with Leg Straight - 2 x daily - 7 x weekly - 1 sets - 3 reps - 30 sec hold Sit to Stand without Arm Support - 2 x daily - 7 x weekly - 1 sets - 5 reps Side Plank on Knees - 2 x daily - 7 x weekly - 1 sets - 3-5 reps - 5 seconds hold Plank on Knees - 2 x daily - 7 x weekly - 1 sets - 10 reps - 5 seconds hold

## 2020-06-02 ENCOUNTER — Ambulatory Visit: Payer: BC Managed Care – PPO | Admitting: Sports Medicine

## 2020-06-02 ENCOUNTER — Other Ambulatory Visit: Payer: Self-pay

## 2020-06-02 ENCOUNTER — Encounter: Payer: BC Managed Care – PPO | Admitting: Physical Therapy

## 2020-06-04 ENCOUNTER — Ambulatory Visit (INDEPENDENT_AMBULATORY_CARE_PROVIDER_SITE_OTHER): Payer: BC Managed Care – PPO | Admitting: Sports Medicine

## 2020-06-04 DIAGNOSIS — M5416 Radiculopathy, lumbar region: Secondary | ICD-10-CM

## 2020-06-04 NOTE — Assessment & Plan Note (Addendum)
Amber Mcmillan returns, she is a pleasant 44 year old female, we have been treating her now for 6 weeks for low back pain with radiation down the left leg in an L5 and S1 distribution, she has done physical therapy, steroids, meloxicam, gabapentin currently at 300 mg 3 times daily. She is getting some improvement but occasionally has to back off the dose due to sedation. At this point because of persistent numbness we are to proceed with MRI for epidural planning. Return to see me to go over MRI results.  MRI shows evidence of left L4 nerve root compression, proceeding with left L4-L5 transforaminal epidural [facility].

## 2020-06-04 NOTE — Progress Notes (Addendum)
    Procedures performed today:    None.  Independent interpretation of notes and tests performed by another provider:   MRI does show evidence of left L4 nerve root compression.  Brief History, Exam, Impression, and Recommendations:    Left lumbar radiculitis Amber Mcmillan returns, she is a pleasant 44 year old female, we have been treating her now for 6 weeks for low back pain with radiation down the left leg in an L5 and S1 distribution, she has done physical therapy, steroids, meloxicam, gabapentin currently at 300 mg 3 times daily. She is getting some improvement but occasionally has to back off the dose due to sedation. At this point because of persistent numbness we are to proceed with MRI for epidural planning. Return to see me to go over MRI results.  MRI shows evidence of left L4 nerve root compression, proceeding with left L4-L5 transforaminal epidural [facility].    ___________________________________________ [name]. [name], M.D., ABFM., CAQSM. Primary Care and [place] Instructor of [employer] [facility] of Medicine

## 2020-06-11 ENCOUNTER — Other Ambulatory Visit: Payer: Self-pay

## 2020-06-11 ENCOUNTER — Ambulatory Visit (INDEPENDENT_AMBULATORY_CARE_PROVIDER_SITE_OTHER): Payer: BC Managed Care – PPO | Admitting: Physical Therapy

## 2020-06-11 ENCOUNTER — Encounter: Payer: Self-pay | Admitting: Physical Therapy

## 2020-06-11 DIAGNOSIS — M5416 Radiculopathy, lumbar region: Secondary | ICD-10-CM

## 2020-06-11 DIAGNOSIS — R29898 Other symptoms and signs involving the musculoskeletal system: Secondary | ICD-10-CM

## 2020-06-11 NOTE — Therapy (Addendum)
[place], [place], [address] Phone: [phone]   Fax:  [phone]  Physical Therapy Treatment/ Discharge Summary  Patient Details  Name: Amber Mcmillan MRN: [id number] Date of Birth: 1975/06/18 Referring Provider (PT): Dr [name]   Encounter Date: 06/11/2020   PT End of Session - 06/11/20 0858    Visit Number 8    Number of Visits 12    Date for PT Re-Evaluation 06/10/20    PT Start Time 0852    PT Stop Time 0940    PT Time Calculation (min) 48 min    Activity Tolerance Patient tolerated treatment well    Behavior During Therapy [facility] for tasks assessed/performed           Past Medical History:  Diagnosis Date  . Benign cyst of right breast 08/09/2017  . Fatty infiltration of liver 07/04/2017  . Headache   . Hypertension   . Nephrolithiasis   . Obesity   . Prediabetes 06/19/2017    Past Surgical History:  Procedure Laterality Date  . ABDOMINAL HYSTERECTOMY    . TUBAL LIGATION      There were no vitals filed for this visit.   Subjective Assessment - 06/11/20 0853    Subjective "Holidays were pretty rough.  I had more pain and numbness".  [place] and noticed increased radicular symptoms the longer she walked.  Today she has occasional catch in Lt SI region, but otherwise no pain.  Pt verbalizes desire to hold therapy until after epidural and see if she would like to continue PT.    Patient Stated Goals get rid of pain and keep symptoms from coming back    Currently in Pain? No/denies    Pain Score 0-No pain              [facility] PT Assessment - 06/11/20 0001      Assessment   Medical Diagnosis Lumbar radiculitis; Lt LE pain     Referring Provider (PT) Dr [name]    Onset Date/Surgical Date 04/02/20      Observation/Other Assessments   Focus on Therapeutic Outcomes (FOTO)  44% limited  (improved from 49% limited)           [facility] Adult PT  Treatment/Exercise - 06/11/20 0001      Lumbar Exercises: Stretches   Passive Hamstring Stretch Left;2 reps;30 seconds;Right;1 rep   seated with straight back; good tolerance.   Piriformis Stretch Left;2 reps;20 seconds;Right;1 rep    Other Lumbar Stretch Exercise hooklying with legs on green pball with gentle rocking back and forth (side to side) for lumbar rotation x 2 min      Lumbar Exercises: Aerobic   Tread Mill 4.5 min at 1.5-2.0 mph      Lumbar Exercises: Standing   Other Standing Lumbar Exercises TA set with arm press onto green pball on table x 5 sec x 5 reps, then 5 reps of lifting ball off of table with core engaged.      Lumbar Exercises: Supine   Bridge 5 reps   3 sec hold; core engaged. no increase in symptoms.   Other Supine Lumbar Exercises Legs on green pball in hooklying: TA set with alternating shoulder flexion to end range x 5 reps each (slow)      Lumbar Exercises: Quadruped   Other Quadruped Lumbar Exercises quadriped rocking into heel sitting for stretch      Traction   Min (lbs) 40  Max (lbs) 55    Hold Time 20    Rest Time 20    Time 15                       PT Long Term Goals - 06/11/20 0912      PT LONG TERM GOAL #1   Title Decrease frequency, intensity, duration of pain by 75-90% allowing patient to perform normal functional activities    Baseline No longer has pain, but has numbness frequently.    Time 6    Period Weeks    Status Partially Met      PT LONG TERM GOAL #2   Title Improve tissue extensibility and ROM through Lt hip and LE    Time 6    Period Weeks    Status On-going      PT LONG TERM GOAL #3   Title Patient reports ability to move sit to stand and stand/walk for functional distances/time (20 min) with minimal to no increase in pain    Time 6    Period Weeks    Status Partially Met      PT LONG TERM GOAL #4   Title Independent in HEP    Time 6    Period Weeks    Status On-going      PT LONG TERM GOAL #5    Title Improve FOTO to </= 37% limitation    Time 6    Period Weeks    Status On-going           UPDATED LONG TERM GOALS:  PT Long Term Goals - 06/20/20 1612      PT LONG TERM GOAL #1   Title Decrease frequency, intensity, duration of pain by 75-90% allowing patient to perform normal functional activities    Baseline No longer has pain, but has numbness frequently.    Time 6    Period Weeks    Status Revised    Target Date 08/01/20      PT LONG TERM GOAL #2   Title Improve tissue extensibility and ROM through Lt hip and LE    Time 6    Period Weeks    Status Revised    Target Date 08/01/20      PT LONG TERM GOAL #3   Title Patient reports ability to move sit to stand and stand/walk for functional distances/time (20 min) with minimal to no increase in pain    Time 6    Period Weeks    Status Revised    Target Date 08/01/20      PT LONG TERM GOAL #4   Title Independent in HEP    Time 6    Period Weeks    Status Revised    Target Date 08/01/20      PT LONG TERM GOAL #5   Title Improve FOTO to </= 37% limitation    Time 6    Period Weeks    Status Revised    Target Date 08/01/20                Plan - 06/11/20 5056    Clinical Impression Statement Pt tolerated gentle core / spine stabilization exercises well, without increase in symptoms. She did report increase in LLE radicular symptoms at 4 min of walking on treadmill at 2.28mh; reduced with hooklying position with LEs on ball.  Pt continues to have intermittent numbness in LLE depending on activity .  Pt has partially  met her goals and verbalizes desire to hold therapy at this time.  Encouraged pt to continue gentle stretching and core strengthening, avoiding aggrivating factors that increase symptoms.    Rehab Potential Good    PT Frequency 2x / week    PT Duration 6 weeks    PT Treatment/Interventions ADLs/Self Care Home Management;Aquatic Therapy;Cryotherapy;Electrical Stimulation;Iontophoresis 7m/ml  Dexamethasone;Moist Heat;Ultrasound;Traction;Gait training;Stair training;Functional mobility training;Therapeutic activities;Therapeutic exercise;Balance training;Neuromuscular re-education;Patient/family education;Manual techniques;Dry needling;Taping    PT Next Visit Plan hold PT until 07/11/20; if pt doesn't return prior to then will d/c.    PT [place] Agree with Plan of Care Patient           Patient will benefit from skilled therapeutic intervention in order to improve the following deficits and impairments:     Visit Diagnosis: Left lumbar radiculitis  Other symptoms and signs involving the musculoskeletal system     Problem List Patient Active Problem List   Diagnosis Date Noted  . Left lumbar radiculitis 04/21/2020  . Abnormal peripheral vision of right eye 10/17/2017  . Elevated blood pressure reading 09/16/2017  . Class 2 obesity due to excess calories without serious comorbidity in adult 09/16/2017  . Primary insomnia 09/16/2017  . Benign cyst of right breast 08/09/2017  . Fatty infiltration of liver 07/04/2017  . Renal mass, left 07/04/2017  . Family history of colon cancer 06/27/2017  . Other specified disorders of kidney and ureter 06/27/2017  . History of nephrolithiasis 06/27/2017  . Prediabetes 06/19/2017  . Hyperglycemia 06/17/2017  . History of abnormal mammogram 06/17/2017  . H/O hysterectomy for benign disease 06/17/2017  . Abnormal CT scan, kidney 06/17/2017    PHYSICAL THERAPY DISCHARGE SUMMARY  Visits from Start of Care: 8  Current functional level related to goals / functional outcomes: See note above for status at renewal-- patient did not return.   Remaining deficits: See note above.   Education / Equipment: HEP  Plan:                                                    Patient goals were not met. Patient is being discharged due to not returning since the last visit.  ?????          Thank you  for the referral of this patient. CRudell Cobb MPT  WEAVER,CHRISTINA, PHemingway PTA 06/11/20 10:05 AM  [facility] [address]Phone: [phone]  Fax:  [phone] Name: WEisha ChatterjeeMRN: [id number] of Birth: 305/20/1977

## 2020-06-15 ENCOUNTER — Ambulatory Visit (INDEPENDENT_AMBULATORY_CARE_PROVIDER_SITE_OTHER): Payer: BC Managed Care – PPO

## 2020-06-15 ENCOUNTER — Other Ambulatory Visit: Payer: Self-pay

## 2020-06-15 DIAGNOSIS — M5126 Other intervertebral disc displacement, lumbar region: Secondary | ICD-10-CM | POA: Diagnosis not present

## 2020-06-16 NOTE — Addendum Note (Signed)
Addended by: Monica Becton on: 06/16/2020 05:42 PM   Modules accepted: Orders

## 2020-06-20 NOTE — Addendum Note (Signed)
Addended by: Rudell Cobb M on: 06/20/2020 04:15 PM   Modules accepted: Orders

## 2020-06-24 ENCOUNTER — Ambulatory Visit
Admission: RE | Admit: 2020-06-24 | Discharge: 2020-06-24 | Disposition: A | Discharge status: Home / Self Care | Payer: BC Managed Care – PPO | Source: Ambulatory Visit | Attending: Sports Medicine | Admitting: Sports Medicine

## 2020-06-24 DIAGNOSIS — M5416 Radiculopathy, lumbar region: Secondary | ICD-10-CM

## 2020-06-24 MED ORDER — IOPAMIDOL (ISOVUE-M 200) INJECTION 41%
1.0000 mL | Freq: Once | INTRAMUSCULAR | Status: AC
  Administered 2020-06-24: 1 mL via EPIDURAL

## 2020-06-24 MED ORDER — METHYLPREDNISOLONE ACETATE 40 MG/ML INJ SUSP (RADIOLOG
120.0000 mg | Freq: Once | INTRAMUSCULAR | Status: AC
  Administered 2020-06-24: 120 mg via EPIDURAL

## 2020-06-24 NOTE — Discharge Instructions (Signed)

## 2021-04-20 ENCOUNTER — Other Ambulatory Visit: Payer: Self-pay | Admitting: Medical-Surgical

## 2021-04-20 DIAGNOSIS — Z1231 Encounter for screening mammogram for malignant neoplasm of breast: Secondary | ICD-10-CM

## 2021-04-22 ENCOUNTER — Ambulatory Visit (INDEPENDENT_AMBULATORY_CARE_PROVIDER_SITE_OTHER): Payer: BC Managed Care – PPO

## 2021-04-22 ENCOUNTER — Other Ambulatory Visit: Payer: Self-pay

## 2021-04-22 DIAGNOSIS — R928 Other abnormal and inconclusive findings on diagnostic imaging of breast: Secondary | ICD-10-CM

## 2021-04-22 DIAGNOSIS — Z1231 Encounter for screening mammogram for malignant neoplasm of breast: Secondary | ICD-10-CM

## 2021-05-12 ENCOUNTER — Other Ambulatory Visit: Payer: Self-pay | Admitting: Medical-Surgical

## 2021-05-12 ENCOUNTER — Ambulatory Visit
Admission: RE | Admit: 2021-05-12 | Discharge: 2021-05-12 | Disposition: A | Discharge status: Home / Self Care | Payer: BC Managed Care – PPO | Source: Ambulatory Visit | Attending: Medical-Surgical | Admitting: Medical-Surgical

## 2021-05-12 ENCOUNTER — Other Ambulatory Visit: Payer: Self-pay

## 2021-05-12 DIAGNOSIS — R928 Other abnormal and inconclusive findings on diagnostic imaging of breast: Secondary | ICD-10-CM

## 2021-06-04 NOTE — Patient Instructions (Signed)

## 2021-06-04 NOTE — Progress Notes (Signed)
HPI: Amber Mcmillan is a 45 y.o. female who  has a past medical history of Benign cyst of right breast (08/09/2017), Fatty infiltration of liver (07/04/2017), Headache, Hypertension, Nephrolithiasis, Obesity, and Prediabetes (06/19/2017).  she presents to [facility] today, 06/05/21,  for chief complaint of: Annual physical exam  Dentist: UTD, no concerns Eye exam: 2020, needs to schedule, glasses/contacts Exercise: none intentional Diet: no restriction Pap smear: Hysterectomy Mammogram: UTD Colon cancer screening: referring for colonoscopy COVID vaccine: done, no booster yet  Concerns: Mood/sleep issues  Past medical, surgical, social and family history reviewed:  Patient Active Problem List   Diagnosis Date Noted   Left lumbar radiculitis 04/21/2020   Abnormal peripheral vision of right eye 10/17/2017   Elevated blood pressure reading 09/16/2017   Class 2 obesity due to excess calories without serious comorbidity in adult 09/16/2017   Primary insomnia 09/16/2017   Benign cyst of right breast 08/09/2017   Fatty infiltration of liver 07/04/2017   Renal mass, left 07/04/2017   Family history of colon cancer 06/27/2017   Other specified disorders of kidney and ureter 06/27/2017   History of nephrolithiasis 06/27/2017   Prediabetes 06/19/2017   Hyperglycemia 06/17/2017   History of abnormal mammogram 06/17/2017   H/O hysterectomy for benign disease 06/17/2017   Abnormal CT scan, kidney 06/17/2017    Past Surgical History:  Procedure Laterality Date   ABDOMINAL HYSTERECTOMY     TUBAL LIGATION      Social History   Tobacco Use   Smoking status: Never   Smokeless tobacco: Never  Substance Use Topics   Alcohol use: Yes    Family History  Problem Relation Age of Onset   Hypertension Mother    Gout Mother    Hyperlipidemia Father    Hypertension Brother    Gout Brother    Diabetes Maternal Aunt    Renal Disease Maternal Aunt     Colon cancer Paternal Uncle    Colon cancer Maternal Grandmother    Colon cancer Paternal Uncle      Current medication list and allergy/intolerance information reviewed:    Current Outpatient Medications  Medication Sig Dispense Refill   aspirin-acetaminophen-caffeine (EXCEDRIN MIGRAINE) 250-250-65 MG tablet Take by mouth every 6 (six) hours as needed.     FLUoxetine (PROZAC) 10 MG capsule Take 2m (1 capsule) daily for 7 days then increase to 234m(2 capsules) daily. 90 capsule 3   mometasone (ELOCON) 0.1 % ointment Apply topically daily. 45 g 0   gabapentin (NEURONTIN) 300 MG capsule One tab PO qHS for a week, then BID for a week, then TID. May double weekly to a max of 3,60039may 90 capsule 3   meloxicam (MOBIC) 15 MG tablet Take 0.5-1 tablets (7.5-15 mg total) by mouth daily. 30 tablet 0   No current facility-administered medications for this visit.    Allergies  Allergen Reactions   Morphine And Related Anaphylaxis   Latex Rash      Review of Systems: Constitutional:  No  fever, no chills, No recent illness, No unintentional weight changes. No significant fatigue.  HEENT: No  headache, no vision change, no hearing change, No sore throat, No  sinus pressure Cardiac: No  chest pain, No  pressure, No palpitations, No  Orthopnea Respiratory:  No  shortness of breath. No  Cough Gastrointestinal: No  abdominal pain, No  nausea, No  vomiting,  No  blood in stool, No  diarrhea, No  constipation  Musculoskeletal: No new  myalgia/arthralgia Skin: No  Rash, No other wounds/concerning lesions Genitourinary: No  incontinence, No  abnormal genital bleeding, No abnormal genital discharge Hem/Onc: No  easy bruising/bleeding, No  abnormal lymph node Endocrine: No cold intolerance,  No heat intolerance. No polyuria/polydipsia/polyphagia  Neurologic: No  weakness, No  dizziness, No  slurred speech/focal weakness/facial droop Psychiatric: + concerns with depression, +  concerns with  anxiety, + sleep problems, + mood problems  Exam:  BP 136/85 (BP Location: Right Arm, Patient Position: Sitting, Cuff Size: Large)    Pulse 77    Resp 20    Ht '5\' 2"'  (1.575 m)    Wt 200 lb 6.4 oz (90.9 kg)    SpO2 97%    BMI 36.65 kg/m  Constitutional: VS see above. General Appearance: alert, well-developed, well-nourished, NAD Eyes: Normal lids and conjunctive, non-icteric sclera Ears, Nose, Mouth, Throat: MMM, Normal external inspection ears/nares/mouth/lips/gums. TM normal bilaterally.   Neck: No masses, trachea midline. + thyroid enlargement. No tenderness/mass appreciated. No lymphadenopathy Respiratory: Normal respiratory effort. no wheeze, no rhonchi, no rales Cardiovascular: S1/S2 normal, no murmur, no rub/gallop auscultated. RRR. No lower extremity edema. Pedal pulse II/IV bilaterally DP and PT. No carotid bruit or JVD. No abdominal aortic bruit. Gastrointestinal: Nontender, no masses. No hepatomegaly, no splenomegaly. No hernia appreciated. Bowel sounds normal. Rectal exam deferred.  Musculoskeletal: Gait normal. No clubbing/cyanosis of digits.  Neurological: Normal balance/coordination. No tremor. No cranial nerve deficit on limited exam. Motor and sensation intact and symmetric. Cerebellar reflexes intact.  Skin: warm, dry, intact. No rash/ulcer. No concerning nevi or subq nodules on limited exam.   Psychiatric: Normal judgment/insight. Normal mood and affect. Oriented x3.    ASSESSMENT/PLAN:   1. Annual physical exam Checking labs today. Wellness information provided with AVS.  - Lipid panel - COMPLETE METABOLIC PANEL WITH GFR - CBC with Differential/Platelet  2. Prediabetes Checking A1c.  - Hemoglobin A1c  3. Left lumbar radiculitis Continue Gabapentin 659m nightly as needed. Refills sent.  - gabapentin (NEURONTIN) 300 MG capsule; One tab PO qHS for a week, then BID for a week, then TID. May double weekly to a max of 3,6032mday  Dispense: 90 capsule; Refill: 3  4.  Thyromegaly New onset thyromegaly, no nodularity. Checking TSH. Thyroid [place] further evaluation.  - TSH - [place]; Future  5. Colon cancer screening Reviewed options. Strong family history of colon cancer on both sides so opting for colonoscopy. Referring to GI.  - Ambulatory referral to Gastroenterology  6. Anxiety with depression Starting Fluoxetine 1087maily for 1 week then increase to 56m29mily. Referring to behavioral health for counseling. Discussed expectations for potential side effects and medication effectiveness.  - Ambulatory referral to [place]oxetine (PROZAC) 10 MG capsule; Take 10mg85mcapsule) daily for 7 days then increase to 56mg 34mapsules) daily.  Dispense: 90 capsule; Refill: 3  Orders Placed This Encounter  Procedures   [place] TH[place]   TSH   Lipid panel   COMPLETE METABOLIC PANEL WITH GFR   CBC with Differential/Platelet   Hemoglobin A1c   Ambulatory referral to [place]hav[place]ulatory referral to Gastroenterology    Meds ordered this encounter  Medications   mometasone (ELOCON) 0.1 % ointment    Sig: Apply topically daily.    Dispense:  45 g    Refill:  0   meloxicam (MOBIC) 15 MG tablet    Sig: Take 0.5-1 tablets (7.5-15 mg total) by mouth daily.    Dispense:  30 tablet    Refill:  0   gabapentin (NEURONTIN) 300 MG capsule    Sig: One tab PO qHS for a week, then BID for a week, then TID. May double weekly to a max of 3,631m/day    Dispense:  90 capsule    Refill:  3    Order Specific Question:   Supervising Provider    Answer:   MATTHEWS, CODY [[id number]]   FLUoxetine (PROZAC) 10 MG capsule    Sig: Take 142m(1 capsule) daily for 7 days then increase to 2057m2 capsules) daily.    Dispense:  90 capsule    Refill:  3    Order Specific Question:   Supervising Provider    Answer:   MATLuetta [id number]]    Patient Instructions  Preventive Care 40-22 25ars Old, Female Preventive care refers to lifestyle choices and visits  with your health care provider that can promote health and wellness. Preventive care visits are also called wellness exams. What can I expect for my preventive care visit? Counseling Your health care provider may ask you questions about your: Medical history, including: Past medical problems. Family medical history. Pregnancy history. Current health, including: Menstrual cycle. Method of birth control. Emotional well-being. Home life and relationship well-being. Sexual activity and sexual health. Lifestyle, including: Alcohol, nicotine or tobacco, and drug use. Access to firearms. Diet, exercise, and sleep habits. Work and work envStatisticianunscreen use. Safety issues such as seatbelt and bike helmet use. Physical exam Your health care provider will check your: Height and weight. These may be used to calculate your BMI (body mass index). BMI is a measurement that tells if you are at a healthy weight. Waist circumference. This measures the distance around your waistline. This measurement also tells if you are at a healthy weight and may help predict your risk of certain diseases, such as type 2 diabetes and high blood pressure. Heart rate and blood pressure. Body temperature. Skin for abnormal spots. What immunizations do I need? Vaccines are usually given at various ages, according to a schedule. Your health care provider will recommend vaccines for you based on your age, medical history, and lifestyle or other factors, such as travel or where you work. What tests do I need? Screening Your health care provider may recommend screening tests for certain conditions. This may include: Lipid and cholesterol levels. Diabetes screening. This is done by checking your blood sugar (glucose) after you have not eaten for a while (fasting). Pelvic exam and Pap test. Hepatitis B test. Hepatitis C test. HIV (human immunodeficiency virus) test. STI (sexually transmitted infection) testing, if  you are at risk. Lung cancer screening. Colorectal cancer screening. Mammogram. Talk with your health care provider about when you should start having regular mammograms. This may depend on whether you have a family history of breast cancer. BRCA-related cancer screening. This may be done if you have a family history of breast, ovarian, tubal, or peritoneal cancers. Bone density scan. This is done to screen for osteoporosis. Talk with your health care provider about your test results, treatment options, and if necessary, the need for more tests. Follow these instructions at home: Eating and drinking  Eat a diet that includes fresh fruits and vegetables, whole grains, lean protein, and low-fat dairy products. Take vitamin and mineral supplements as recommended by your health care provider. Do not drink alcohol if: Your health care provider tells you not to drink. You are pregnant, may be pregnant, or are planning to  become pregnant. If you drink alcohol: Limit how much you have to 0-1 drink a day. Know how much alcohol is in your drink. In the [place] one drink equals one 12 oz bottle of beer (355 mL), one 5 oz glass of wine (148 mL), or one 1 oz glass of hard liquor (44 mL). Lifestyle Brush your teeth every morning and night with fluoride toothpaste. Floss one time each day. Exercise for at least 30 minutes 5 or more days each week. Do not use any products that contain nicotine or tobacco. These products include cigarettes, chewing tobacco, and vaping devices, such as e-cigarettes. If you need help quitting, ask your health care provider. Do not use drugs. If you are sexually active, practice safe sex. Use a condom or other form of protection to prevent STIs. If you do not wish to become pregnant, use a form of birth control. If you plan to become pregnant, see your health care provider for a prepregnancy visit. Take aspirin only as told by your health care provider. Make sure that you  understand how much to take and what form to take. Work with your health care provider to find out whether it is safe and beneficial for you to take aspirin daily. Find healthy ways to manage stress, such as: Meditation, yoga, or listening to music. Journaling. Talking to a trusted person. Spending time with friends and family. Minimize exposure to UV radiation to reduce your risk of skin cancer. Safety Always wear your seat belt while driving or riding in a vehicle. Do not drive: If you have been drinking alcohol. Do not ride with someone who has been drinking. When you are tired or distracted. While texting. If you have been using any mind-altering substances or drugs. Wear a helmet and other protective equipment during sports activities. If you have firearms in your house, make sure you follow all gun safety procedures. Seek help if you have been physically or sexually abused. What's next? Visit your health care provider once a year for an annual wellness visit. Ask your health care provider how often you should have your eyes and teeth checked. Stay up to date on all vaccines. This information is not intended to replace advice given to you by your health care provider. Make sure you discuss any questions you have with your health care provider. Document Revised: 11/26/2020 Document Reviewed: 11/26/2020 Elsevier Patient Education  [place].  Follow-up plan: Return in about 6 weeks (around 07/17/2021) for mood follow up.  Clearnce Sorrel, DNP, APRN, FNP-BC [facility]

## 2021-06-05 ENCOUNTER — Ambulatory Visit (INDEPENDENT_AMBULATORY_CARE_PROVIDER_SITE_OTHER): Payer: BC Managed Care – PPO | Admitting: Medical-Surgical

## 2021-06-05 ENCOUNTER — Other Ambulatory Visit: Payer: Self-pay

## 2021-06-05 ENCOUNTER — Encounter: Payer: Self-pay | Admitting: Medical-Surgical

## 2021-06-05 VITALS — BP 136/85 | HR 77 | Resp 20 | Ht 62.0 in | Wt 200.4 lb

## 2021-06-05 DIAGNOSIS — Z0000 Encounter for general adult medical examination without abnormal findings: Secondary | ICD-10-CM | POA: Diagnosis not present

## 2021-06-05 DIAGNOSIS — Z1211 Encounter for screening for malignant neoplasm of colon: Secondary | ICD-10-CM

## 2021-06-05 DIAGNOSIS — M5416 Radiculopathy, lumbar region: Secondary | ICD-10-CM

## 2021-06-05 DIAGNOSIS — E010 Iodine-deficiency related diffuse (endemic) goiter: Secondary | ICD-10-CM | POA: Diagnosis not present

## 2021-06-05 DIAGNOSIS — R7303 Prediabetes: Secondary | ICD-10-CM | POA: Diagnosis not present

## 2021-06-05 DIAGNOSIS — F418 Other specified anxiety disorders: Secondary | ICD-10-CM

## 2021-06-05 MED ORDER — MELOXICAM 15 MG PO TABS: 7.5000 mg | ORAL_TABLET | Freq: Every day | ORAL | 0 refills | Status: DC

## 2021-06-05 MED ORDER — FLUOXETINE HCL 10 MG PO CAPS: ORAL_CAPSULE | ORAL | 3 refills | Status: DC

## 2021-06-05 MED ORDER — GABAPENTIN 300 MG PO CAPS: ORAL_CAPSULE | ORAL | 3 refills | Status: DC

## 2021-06-05 MED ORDER — MOMETASONE FUROATE 0.1 % EX OINT: TOPICAL_OINTMENT | Freq: Every day | CUTANEOUS | 0 refills | Status: DC

## 2021-06-06 LAB — CBC WITH DIFFERENTIAL/PLATELET
Absolute Monocytes: 814 cells/uL (ref 200–950)
Basophils Absolute: 24 cells/uL (ref 0–200)
Basophils Relative: 0.3 %
Eosinophils Absolute: 63 cells/uL (ref 15–500)
Eosinophils Relative: 0.8 %
HCT: 41 % (ref 35.0–45.0)
Hemoglobin: 13.9 g/dL (ref 11.7–15.5)
Lymphs Abs: 2425 cells/uL (ref 850–3900)
MCH: 30.3 pg (ref 27.0–33.0)
MCHC: 33.9 g/dL (ref 32.0–36.0)
MCV: 89.5 fL (ref 80.0–100.0)
MPV: 11.2 fL (ref 7.5–12.5)
Monocytes Relative: 10.3 %
Neutro Abs: 4574 cells/uL (ref 1500–7800)
Neutrophils Relative %: 57.9 %
Platelets: 326 10*3/uL (ref 140–400)
RBC: 4.58 10*6/uL (ref 3.80–5.10)
RDW: 13.2 % (ref 11.0–15.0)
Total Lymphocyte: 30.7 %
WBC: 7.9 10*3/uL (ref 3.8–10.8)

## 2021-06-06 LAB — COMPLETE METABOLIC PANEL WITH GFR
AG Ratio: 1.3 (calc) (ref 1.0–2.5)
ALT: 13 U/L (ref 6–29)
AST: 15 U/L (ref 10–35)
Albumin: 4.1 g/dL (ref 3.6–5.1)
Alkaline phosphatase (APISO): 61 U/L (ref 31–125)
BUN/Creatinine Ratio: 11 (calc) (ref 6–22)
BUN: 12 mg/dL (ref 7–25)
CO2: 27 mmol/L (ref 20–32)
Calcium: 8.9 mg/dL (ref 8.6–10.2)
Chloride: 105 mmol/L (ref 98–110)
Creat: 1.05 mg/dL — ABNORMAL HIGH (ref 0.50–0.99)
Globulin: 3.2 g/dL (calc) (ref 1.9–3.7)
Glucose, Bld: 95 mg/dL (ref 65–99)
Potassium: 4.2 mmol/L (ref 3.5–5.3)
Sodium: 141 mmol/L (ref 135–146)
Total Bilirubin: 0.3 mg/dL (ref 0.2–1.2)
Total Protein: 7.3 g/dL (ref 6.1–8.1)
eGFR: 66 mL/min/{1.73_m2} (ref >=60)

## 2021-06-06 LAB — LIPID PANEL
Cholesterol: 199 mg/dL (ref <200)
HDL: 65 mg/dL (ref >=50)
LDL Cholesterol (Calc): 115 mg/dL (calc) — ABNORMAL HIGH
Non-HDL Cholesterol (Calc): 134 mg/dL (calc) — ABNORMAL HIGH (ref <130)
Total CHOL/HDL Ratio: 3.1 (calc) (ref <5.0)
Triglycerides: 87 mg/dL (ref <150)

## 2021-06-06 LAB — HEMOGLOBIN A1C
Hgb A1c MFr Bld: 6 % of total Hgb — ABNORMAL HIGH (ref <5.7)
Mean Plasma Glucose: 126 mg/dL
eAG (mmol/L): 7 mmol/L

## 2021-06-06 LAB — TSH: TSH: 1.47 mIU/L

## 2021-06-12 ENCOUNTER — Other Ambulatory Visit: Payer: Self-pay

## 2021-06-12 ENCOUNTER — Ambulatory Visit (INDEPENDENT_AMBULATORY_CARE_PROVIDER_SITE_OTHER): Payer: BC Managed Care – PPO

## 2021-06-12 DIAGNOSIS — E010 Iodine-deficiency related diffuse (endemic) goiter: Secondary | ICD-10-CM | POA: Diagnosis not present

## 2021-06-12 DIAGNOSIS — E041 Nontoxic single thyroid nodule: Secondary | ICD-10-CM

## 2021-06-17 ENCOUNTER — Other Ambulatory Visit (HOSPITAL_COMMUNITY)
Admission: RE | Admit: 2021-06-17 | Discharge: 2021-06-17 | Disposition: A | Discharge status: Home / Self Care | Payer: BC Managed Care – PPO | Source: Ambulatory Visit | Attending: Student | Admitting: Student

## 2021-06-17 ENCOUNTER — Ambulatory Visit
Admission: RE | Admit: 2021-06-17 | Discharge: 2021-06-17 | Disposition: A | Discharge status: Home / Self Care | Payer: BC Managed Care – PPO | Source: Ambulatory Visit | Attending: Medical-Surgical | Admitting: Medical-Surgical

## 2021-06-17 DIAGNOSIS — D34 Benign neoplasm of thyroid gland: Secondary | ICD-10-CM | POA: Insufficient documentation

## 2021-06-17 DIAGNOSIS — E041 Nontoxic single thyroid nodule: Secondary | ICD-10-CM

## 2021-06-17 DIAGNOSIS — E010 Iodine-deficiency related diffuse (endemic) goiter: Secondary | ICD-10-CM

## 2021-06-18 LAB — CYTOLOGY - NON PAP

## 2021-07-02 ENCOUNTER — Other Ambulatory Visit: Payer: Self-pay | Admitting: Medical-Surgical

## 2021-07-17 ENCOUNTER — Other Ambulatory Visit: Payer: Self-pay

## 2021-07-17 ENCOUNTER — Encounter: Payer: Self-pay | Admitting: Medical-Surgical

## 2021-07-17 ENCOUNTER — Ambulatory Visit: Payer: BC Managed Care – PPO | Admitting: Medical-Surgical

## 2021-07-17 VITALS — BP 107/74 | HR 72 | Resp 20 | Ht 62.0 in | Wt 195.8 lb

## 2021-07-17 DIAGNOSIS — F418 Other specified anxiety disorders: Secondary | ICD-10-CM

## 2021-07-17 DIAGNOSIS — G4700 Insomnia, unspecified: Secondary | ICD-10-CM

## 2021-07-17 NOTE — Progress Notes (Signed)
°  HPI with pertinent ROS:   CC: Mood follow-up  HPI: Pleasant 46 year old female presenting today for mood follow-up.  Notes that she stopped fluoxetine about 2 weeks ago as she did not feel like the medication was working.  She does admit that it was helping her sleep at night but not very helpful with mood overall.  Notes that her depressive symptoms and her anxiety has been better lately and that sleep is her main concern.  She has tried multiple agents including L-carnitine, Benadryl, Ashwaghanda, and melatonin.  None of these were helpful.  Notes that she does sleep in the bedroom with her husband and he is unable to sleep without the TV being on.  They do work separate shifts so Monday through Friday, she seems to do well with sleep.  On the weekends is when she is with her husband and wakes several times throughout the night.  Has not tried sleeping in a separate location.  Denies SI/HI.  I reviewed the past medical history, family history, social history, surgical history, and allergies today and no changes were needed.  Please see the problem list section below in epic for further details.   Physical exam:   General: Well Developed, well nourished, and in no acute distress.  Neuro: Alert and oriented x3.  HEENT: Normocephalic, atraumatic.  Skin: Warm and dry. Cardiac: Regular rate and rhythm, no murmurs rubs or gallops, no lower extremity edema.  Respiratory: Clear to auscultation bilaterally. Not using accessory muscles, speaking in full sentences.  Impression and Recommendations:    1. Anxiety with depression Symptoms very well controlled at this point even though she stopped fluoxetine 2 weeks ago.  Suspect she may have a resurgence of some of her symptoms over the next few weeks as her blood concentration of fluoxetine slowly declines.  Advised to monitor for these symptoms and let me know should she feel the need to restart medication.  For now discontinue fluoxetine.  2.  Insomnia, unspecified type Reviewed various over-the-counter and herbal options for helping with sleep.  She does like to avoid medications so feel that an herbal options such as chamomile teas, valerian root, kava, etc. may be more desirable.  If this is not helpful, there are medications that we can try.  Reviewed this with her and she will let me know if she would like to go that route.  Discussed sleep hygiene recommendations.  Ultimately, I think she would benefit from removal of the TV from the bedroom but she notes that her husband would not be able to sleep without it.  Suggested possibly sleeping in a different room to see if this would be helpful.  Return for annual physical exam at your convenience. ___________________________________________ Clearnce Sorrel, DNP, APRN, FNP-BC [facility] [place]

## 2021-07-29 ENCOUNTER — Other Ambulatory Visit: Payer: Self-pay

## 2021-07-29 ENCOUNTER — Ambulatory Visit (AMBULATORY_SURGERY_CENTER): Payer: BC Managed Care – PPO | Admitting: *Deleted

## 2021-07-29 VITALS — Ht 62.0 in | Wt 193.0 lb

## 2021-07-29 DIAGNOSIS — Z1211 Encounter for screening for malignant neoplasm of colon: Secondary | ICD-10-CM

## 2021-07-29 MED ORDER — PEG 3350-KCL-NA BICARB-NACL 420 G PO SOLR: 4000.0000 mL | Freq: Once | ORAL | 0 refills | Status: AC

## 2021-07-29 NOTE — Progress Notes (Signed)

## 2021-08-04 ENCOUNTER — Ambulatory Visit (INDEPENDENT_AMBULATORY_CARE_PROVIDER_SITE_OTHER): Payer: BC Managed Care – PPO | Admitting: Licensed Clinical Social Worker

## 2021-08-04 DIAGNOSIS — F331 Major depressive disorder, recurrent, moderate: Secondary | ICD-10-CM | POA: Diagnosis not present

## 2021-08-04 DIAGNOSIS — F419 Anxiety disorder, unspecified: Secondary | ICD-10-CM | POA: Diagnosis not present
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
## 2021-08-10 ENCOUNTER — Encounter: Payer: Self-pay | Admitting: Gastroenterology

## 2021-08-12 ENCOUNTER — Encounter: Payer: Self-pay | Admitting: Gastroenterology

## 2021-08-12 ENCOUNTER — Ambulatory Visit (AMBULATORY_SURGERY_CENTER): Payer: BC Managed Care – PPO | Admitting: Gastroenterology

## 2021-08-12 ENCOUNTER — Other Ambulatory Visit: Payer: Self-pay

## 2021-08-12 VITALS — BP 142/80 | HR 64 | Temp 96.6°F | Resp 15 | Ht 62.0 in | Wt 193.0 lb

## 2021-08-12 DIAGNOSIS — Z1211 Encounter for screening for malignant neoplasm of colon: Secondary | ICD-10-CM | POA: Diagnosis not present

## 2021-08-12 DIAGNOSIS — K621 Rectal polyp: Secondary | ICD-10-CM

## 2021-08-12 DIAGNOSIS — D128 Benign neoplasm of rectum: Secondary | ICD-10-CM

## 2021-08-12 MED ORDER — SODIUM CHLORIDE 0.9 % IV SOLN: 500.0000 mL | Freq: Once | INTRAVENOUS | Status: DC

## 2021-08-12 NOTE — Patient Instructions (Signed)
YOU HAD AN ENDOSCOPIC PROCEDURE TODAY AT THE [facility]:   Refer to the procedure report that was given to you for any specific questions about what was found during the examination.  If the procedure report does not answer your questions, please call your gastroenterologist to clarify.  If you requested that your care partner not be given the details of your procedure findings, then the procedure report has been included in a sealed envelope for you to review at your convenience later.  YOU SHOULD EXPECT: Some feelings of bloating in the abdomen. Passage of more gas than usual.  Walking can help get rid of the air that was put into your GI tract during the procedure and reduce the bloating. If you had a lower endoscopy (such as a colonoscopy or flexible sigmoidoscopy) you may notice spotting of blood in your stool or on the toilet paper. If you underwent a bowel prep for your procedure, you may not have a normal bowel movement for a few days.  Please Note:  You might notice some irritation and congestion in your nose or some drainage.  This is from the oxygen used during your procedure.  There is no need for concern and it should clear up in a day or so.  SYMPTOMS TO REPORT IMMEDIATELY:   Following lower endoscopy (colonoscopy or flexible sigmoidoscopy):  Excessive amounts of blood in the stool  Significant tenderness or worsening of abdominal pains  Swelling of the abdomen that is new, acute  Fever of 100F or higher   Following upper endoscopy (EGD)  Vomiting of blood or coffee ground material  New chest pain or pain under the shoulder blades  Painful or persistently difficult swallowing  New shortness of breath  Fever of 100F or higher  Black, tarry-looking stools  For urgent or emergent issues, a gastroenterologist can be reached at any hour by calling [phone]. Do not use MyChart messaging for urgent concerns.    DIET:  We do recommend a small meal at first, but  then you may proceed to your regular diet.  Drink plenty of fluids but you should avoid alcoholic beverages for 24 hours.  ACTIVITY:  You should plan to take it easy for the rest of today and you should NOT DRIVE or use heavy machinery until tomorrow (because of the sedation medicines used during the test).    FOLLOW UP: Our staff will call the number listed on your records 48-72 hours following your procedure to check on you and address any questions or concerns that you may have regarding the information given to you following your procedure. If we do not reach you, we will leave a message.  We will attempt to reach you two times.  During this call, we will ask if you have developed any symptoms of COVID 19. If you develop any symptoms (ie: fever, flu-like symptoms, shortness of breath, cough etc.) before then, please call [phone].  If you test positive for Covid 19 in the 2 weeks post procedure, please call and report this information to us.    If any biopsies were taken you will be contacted by phone or by letter within the next 1-3 weeks.  Please call us at [phone] if you have not heard about the biopsies in 3 weeks.    SIGNATURES/CONFIDENTIALITY: You and/or your care partner have signed paperwork which will be entered into your electronic medical record.  These signatures attest to the fact that that the information above on   your After Visit Summary has been reviewed and is understood.  Full responsibility of the confidentiality of this discharge information lies with you and/or your care-partner. 

## 2021-08-12 NOTE — Progress Notes (Signed)
Called to room to assist during endoscopic procedure.  Patient ID and intended procedure confirmed with present staff. Received instructions for my participation in the procedure from the performing physician.  

## 2021-08-12 NOTE — Progress Notes (Signed)
To pacu VSS. Report to [email or web address].tb 

## 2021-08-12 NOTE — Op Note (Signed)
[place] ?Patient Name: Amber Mcmillan ?Procedure Date: 08/12/2021 11:37 AM ?MRN: [id number] ?Endoscopist: Justice Britain , MD ?Age: 46 ?Referring MD:  ?Date of Birth: May 04, 1976 ?Gender: Female ?Account #: [id number] ?Procedure:                Colonoscopy ?Indications:              Colon cancer screening in patient at increased  ?                          risk: Family history of colorectal cancer in  ?                          multiple 2nd degree relatives (2 PUncles and 1 MGM) ?Medicines:                Monitored Anesthesia Care ?Procedure:                Pre-Anesthesia Assessment: ?                          - Prior to the procedure, a History and Physical  ?                          was performed, and patient medications and  ?                          allergies were reviewed. The patient's tolerance of  ?                          previous anesthesia was also reviewed. The risks  ?                          and benefits of the procedure and the sedation  ?                          options and risks were discussed with the patient.  ?                          All questions were answered, and informed consent  ?                          was obtained. Prior Anticoagulants: The patient has  ?                          taken no previous anticoagulant or antiplatelet  ?                          agents except for NSAID medication. ASA Grade  ?                          Assessment: II - A patient with mild systemic  ?                          disease. After reviewing the risks and benefits,  ?  the patient was deemed in satisfactory condition to  ?                          undergo the procedure. ?                          After obtaining informed consent, the colonoscope  ?                          was passed under direct vision. Throughout the  ?                          procedure, the patient's blood pressure, pulse, and  ?                          oxygen saturations were monitored  continuously. The  ?                          [facility] [id number] was introduced through the  ?                          anus and advanced to the the cecum, identified by  ?                          appendiceal orifice and ileocecal valve. The  ?                          colonoscopy was somewhat difficult due to a  ?                          redundant colon. Successful completion of the  ?                          procedure was aided by changing the patient's  ?                          position, straightening and shortening the scope to  ?                          obtain bowel loop reduction and using scope  ?                          torsion. The patient tolerated the procedure. The  ?                          quality of the bowel preparation was good. The  ?                          ileocecal valve, appendiceal orifice, and rectum  ?                          were photographed. ?Scope In: 11:47:16 AM ?Scope Out: 12:03:50 PM ?Scope Withdrawal Time: 0 hours 10 minutes 39 seconds  ?Total Procedure Duration: 0 hours 16 minutes  34 seconds  ?Findings:                 The digital rectal exam findings include  ?                          hemorrhoids. Pertinent negatives include no  ?                          palpable rectal lesions. ?                          The colon (entire examined portion) revealed  ?                          grossly excessive looping from the redundancy that  ?                          was noted. ?                          Four sessile polyps were found in the rectum. The  ?                          polyps were 2 to 4 mm in size. These polyps were  ?                          removed with a cold snare. Resection and retrieval  ?                          were complete. ?                          Normal mucosa was found in the entire colon  ?                          otherwise. ?                          Non-bleeding non-thrombosed external and internal  ?                          hemorrhoids were  found during retroflexion, during  ?                          perianal exam and during digital exam. The  ?                          hemorrhoids were Grade II (internal hemorrhoids  ?                          that prolapse but reduce spontaneously). ?Complications:            No immediate complications. ?Estimated Blood Loss:     Estimated blood loss: none. ?Impression:               - Hemorrhoids found on digital rectal exam. ?                          -  There was significant looping of the colon. ?                          - Four 2 to 4 mm polyps in the rectum, removed with  ?                          a cold snare. Resected and retrieved. ?                          - Normal mucosa in the entire examined colon  ?                          otherwise. ?                          - Non-bleeding non-thrombosed external and internal  ?                          hemorrhoids. ?Recommendation:           - The patient will be observed post-procedure,  ?                          until all discharge criteria are met. ?                          - Discharge patient to home. ?                          - Patient has a contact number available for  ?                          emergencies. The signs and symptoms of potential  ?                          delayed complications were discussed with the  ?                          patient. Return to normal activities tomorrow.  ?                          Written discharge instructions were provided to the  ?                          patient. ?                          - High fiber diet. ?                          - Use FiberCon 1-2 tablets PO daily. ?                          - Continue present medications. ?                          - Repeat colonoscopy  in 5 years for screening  ?                          purposes (due to multiple members of 2nd degree  ?                          with colon cancer as noted above). If all tissue  ?                          returns as adenomatous then 3-year  follow up  ?                          colonoscopy would be recommended. ?                          - The findings and recommendations were discussed  ?                          with the patient. ?                          - The findings and recommendations were discussed  ?                          with the patient's family. ?Justice Britain, MD ?08/12/2021 12:06:43 PM ?

## 2021-08-12 NOTE — Progress Notes (Signed)
? ?GASTROENTEROLOGY PROCEDURE H&P NOTE  ? ?Primary Care Physician: ?Samuel Bouche, NP ? ?HPI: ?Amber Mcmillan is a 46 y.o. female who presents for Colonoscopy for screening. ? ?Past Medical History:  ?Diagnosis Date  ? Anemia   ? "younger"  ? Benign cyst of right breast 08/09/2017  ? Depression   ? Fatty infiltration of liver 07/04/2017  ? GERD (gastroesophageal reflux disease)   ? Headache   ? Heart murmur   ? "as a baby,nothing as a adult"  ? Nephrolithiasis   ? Neuromuscular disorder ([place])   ? sciatica-left  ? Obesity   ? Prediabetes 06/19/2017  ? Thyroid disease   ? "lump-benign bx 06/16/21  ? ?Past Surgical History:  ?Procedure Laterality Date  ? ABDOMINAL HYSTERECTOMY    ? TUBAL LIGATION    ? ?Current Outpatient Medications  ?Medication Sig Dispense Refill  ? aspirin-acetaminophen-caffeine (EXCEDRIN MIGRAINE) 542-706-23 MG tablet Take by mouth every 6 (six) hours as needed.    ? FLUoxetine (PROZAC) 10 MG capsule Take 10mg  (1 capsule) daily for 7 days then increase to 20mg  (2 capsules) daily. (Patient not taking: Reported on 07/17/2021) 90 capsule 3  ? gabapentin (NEURONTIN) 300 MG capsule One tab PO qHS for a week, then BID for a week, then TID. May double weekly to a max of 3,600mg /day (Patient taking differently: as needed. One tab PO qHS for a week, then BID for a week, then TID. May double weekly to a max of 3,600mg /day) 90 capsule 3  ? meloxicam (MOBIC) 15 MG tablet TAKE 1/2 TO 1 TABLET (7.5-15 MG TOTAL) BY MOUTH DAILY (Patient taking differently: as needed.) 30 tablet 0  ? mometasone (ELOCON) 0.1 % ointment Apply topically daily. (Patient taking differently: Apply topically as needed.) 45 g 0  ? Multiple Vitamins-Minerals (MULTIVITAMIN GUMMIES ADULT PO) Take by mouth daily. Take 2 gummies    ? ?No current facility-administered medications for this visit.  ? ? ?Current Outpatient Medications:  ?  aspirin-acetaminophen-caffeine (EXCEDRIN MIGRAINE) 250-250-65 MG tablet, Take by mouth every 6 (six) hours as  needed., Disp: , Rfl:  ?  FLUoxetine (PROZAC) 10 MG capsule, Take 10mg  (1 capsule) daily for 7 days then increase to 20mg  (2 capsules) daily. (Patient not taking: Reported on 07/17/2021), Disp: 90 capsule, Rfl: 3 ?  gabapentin (NEURONTIN) 300 MG capsule, One tab PO qHS for a week, then BID for a week, then TID. May double weekly to a max of 3,600mg /day (Patient taking differently: as needed. One tab PO qHS for a week, then BID for a week, then TID. May double weekly to a max of 3,600mg /day), Disp: 90 capsule, Rfl: 3 ?  meloxicam (MOBIC) 15 MG tablet, TAKE 1/2 TO 1 TABLET (7.5-15 MG TOTAL) BY MOUTH DAILY (Patient taking differently: as needed.), Disp: 30 tablet, Rfl: 0 ?  mometasone (ELOCON) 0.1 % ointment, Apply topically daily. (Patient taking differently: Apply topically as needed.), Disp: 45 g, Rfl: 0 ?  Multiple Vitamins-Minerals (MULTIVITAMIN GUMMIES ADULT PO), Take by mouth daily. Take 2 gummies, Disp: , Rfl:  ?Allergies  ?Allergen Reactions  ? Morphine And Related Anaphylaxis  ? Latex Rash  ? ?Family History  ?Problem Relation Age of Onset  ? Hypertension Mother   ? Gout Mother   ? Hyperlipidemia Father   ? Hypertension Brother   ? Gout Brother   ? Diabetes Maternal Aunt   ? Renal Disease Maternal Aunt   ? Colon cancer Paternal Uncle   ? Colon cancer Paternal Uncle   ? Colon  cancer Maternal Grandmother   ? Esophageal cancer Neg Hx   ? Rectal cancer Neg Hx   ? Stomach cancer Neg Hx   ? ?Social History  ? ?Socioeconomic History  ? Marital status: Married  ?  Spouse name: Not on file  ? Number of children: Not on file  ? Years of education: Not on file  ? Highest education level: Not on file  ?Occupational History  ? Not on file  ?Tobacco Use  ? Smoking status: Never  ?  Passive exposure: Never  ? Smokeless tobacco: Never  ?Vaping Use  ? Vaping Use: Never used  ?Substance and Sexual Activity  ? Alcohol use: Not Currently  ? Drug use: No  ? Sexual activity: Yes  ?  Partners: Male  ?  Birth control/protection:  Surgical  ?Other Topics Concern  ? Not on file  ?Social History Narrative  ? Not on file  ? ?Social Determinants of Health  ? ?Financial Resource Strain: Not on file  ?Food Insecurity: Not on file  ?Transportation Needs: Not on file  ?Physical Activity: Not on file  ?Stress: Not on file  ?Social Connections: Not on file  ?Intimate Partner Violence: Not on file  ? ? ?Physical Exam: ?There were no vitals filed for this visit. ?There is no height or weight on file to calculate BMI. ?GEN: NAD ?EYE: Sclerae anicteric ?ENT: MMM ?CV: Non-tachycardic ?GI: Soft, NT/ND ?NEURO:  Alert & Oriented x 3 ? ?Lab Results: ?No results for input(s): WBC, HGB, HCT, PLT in the last 72 hours. ?BMET ?No results for input(s): NA, K, CL, CO2, GLUCOSE, BUN, CREATININE, CALCIUM in the last 72 hours. ?LFT ?No results for input(s): PROT, ALBUMIN, AST, ALT, ALKPHOS, BILITOT, BILIDIR, IBILI in the last 72 hours. ?PT/INR ?No results for input(s): LABPROT, INR in the last 72 hours. ? ? ?Impression / Plan: ?This is a 46 y.o.female who presents for Colonoscopy for screening. ? ?The risks and benefits of endoscopic evaluation/treatment were discussed with the patient and/or family; these include but are not limited to the risk of perforation, infection, bleeding, missed lesions, lack of diagnosis, severe illness requiring hospitalization, as well as anesthesia and sedation related illnesses.  The patient's history has been reviewed, patient examined, no change in status, and deemed stable for procedure.  The patient and/or family is agreeable to proceed.  ? ? ?Justice Britain, MD ?[facility] ?Advanced Endoscopy ?Office # [phone] ? ?

## 2021-08-12 NOTE — Progress Notes (Signed)
Pt's states no medical or surgical changes since previsit or office visit.   VS taken by DT 

## 2021-08-14 ENCOUNTER — Telehealth: Payer: Self-pay

## 2021-08-14 NOTE — Telephone Encounter (Signed)
?  Follow up Call- ? ?Call back number 08/12/2021  ?Post procedure Call Back phone  # [phone]  ?Permission to leave phone message Yes  ?Some recent data might be hidden  ?  ? ?Patient questions: ? ?Do you have a fever, pain , or abdominal swelling? No. ?Pain Score  0 * ? ?Have you tolerated food without any problems? Yes.   ? ?Have you been able to return to your normal activities? Yes.   ? ?Do you have any questions about your discharge instructions: ?Diet   No. ?Medications  No. ?Follow up visit  No. ? ?Do you have questions or concerns about your Care? No. ? ?Actions: ?* If pain score is 4 or above: ?No action needed, pain <4. ? ? ?

## 2021-08-17 ENCOUNTER — Encounter: Payer: Self-pay | Admitting: Gastroenterology

## 2021-08-21 ENCOUNTER — Encounter: Payer: Self-pay | Admitting: Gastroenterology

## 2021-08-25 ENCOUNTER — Ambulatory Visit (HOSPITAL_COMMUNITY): Payer: BC Managed Care – PPO | Admitting: Licensed Clinical Social Worker

## 2021-08-25 ENCOUNTER — Encounter (HOSPITAL_COMMUNITY): Payer: Self-pay

## 2021-08-25 NOTE — Progress Notes (Signed)
Therapist contacted patient through My Chart for a session and she did not respond. Session is a no show ?

## 2021-09-08 ENCOUNTER — Ambulatory Visit (INDEPENDENT_AMBULATORY_CARE_PROVIDER_SITE_OTHER): Payer: BC Managed Care – PPO | Admitting: Licensed Clinical Social Worker

## 2021-09-08 DIAGNOSIS — F331 Major depressive disorder, recurrent, moderate: Secondary | ICD-10-CM

## 2021-09-08 DIAGNOSIS — F419 Anxiety disorder, unspecified: Secondary | ICD-10-CM

## 2021-09-08 NOTE — Progress Notes (Signed)
Virtual Visit via Video Note ? ?I connected with Amber Mcmillan on 09/08/21 at  8:00 AM EDT by a video enabled telemedicine application and verified that I am speaking with the correct person using two identifiers. ? ?Location: ?Patient: home ?Provider: office ?  ?I discussed the limitations of evaluation and management by telemedicine and the availability of in person appointments. The patient expressed understanding and agreed to proceed. ? ?I discussed the assessment and treatment plan with the patient. The patient was provided an opportunity to ask questions and all were answered. The patient agreed with the plan and demonstrated an understanding of the instructions. ?  ?The patient was advised to call back or seek an in-person evaluation if the symptoms worsen or if the condition fails to improve as anticipated. ? ?I provided 48 minutes of non-face-to-face time during this encounter. ? ?THERAPIST PROGRESS NOTE ? ?Session Time: 8:00 AM to 8:48 AM ? ?Participation Level: Active ? ?Behavioral Response: CasualAlertappropriate ? ?Type of Therapy: Individual Therapy ? ?Treatment Goals addressed: Anxiety, depression, self-esteem, self care ? ?ProgressTowards Goals: Initial-started working on treatment goals today focusing on self-care setting boundaries as needed, self-esteem ? ?Interventions: Solution Focused, Strength-based, Supportive, and Other: Self-esteem ? ?Summary: Amber Mcmillan is a 46 y.o. female who presents with patient able to look at what she can and cannot control for example on Friday running errands not getting anything done so she reset and able to get half of the errands done able to focus on what she can control.  Therapist provided positive feedback this is what we teach in therapy so shows good insight.  Patient does endorse when she is relaxing her mind keeps going to the next thing she has to do.  In terms of something bad happening she is not a negative thinker but also not  unrealistically positive more realistic. Binge eater can be good stuff sometimes not good stuff. Today she says let's see what happens in terms of moving forward with therapy.  Therapist reviewed PHQ-9 and gad-7 and patient has minimal symptoms why patient says at this point whether to continue regular therapy.  She says she puts herself on back burner there for everybody else. Whether somebody has to talk at 3 in the morning or somebody who is going through something during the day will sit there and talk to them.  Friends say voice of reason, will take on friends issues so hers get put in the back burner. Sometimes it gets very heavy. Explores self-care.  How patient felt about setting limits patient says wants to the person there if people need her don't want them to hurt themselves feeling if they are fine she will be able to manage her own issues and be okay. Feels selfish if doesn't do that.  Explored self nurturing activities patient could add and she relates watch favorite shows pile up to end of the week. Wants some quiet time at night. Cooks some days, talks to the kids, husband usually sleep when first get home he works at night. Lay down and have quiet time. Might talk on the phone, watch funny video on TikTok don't do too much try to wind down. Even then don't get to sleep until 2 AM in the morning. Put Phone on don't disturb from 10-7 AM. Try to have TV off try to get rid of distractions.  Therapist noted this quiet time as a way for patient to try to unwind and relax aspects can consider a self nurturing  as patient learns more about activities including relaxation exercise that she can add as she feels helpful. ? ?Therapist finished assessment and screenings for pain and nutrition as we explored patient's issues identified self-care tied this in with has had issues with self-esteem.  Therapist noted important element for mental health is not only her relationships but making herself a priority and  finding that balance.  Noted patient puts on do not disturb at 10 at night therapist assesses this is a way for her to set boundaries and positive.  We will need to continue to explore with patient whether she needs to shift more focus toward herself and taking care of herself and decreasing symptoms.  Looked at chapter on self-esteem and noted that it comes from within and unconditional but there are different pathways to self-esteem therapist focused on self nurturing activities patient could incorporate noted her ritual at night can be looked at as a type of unwinding relaxing fitting into the category of self-care patient says she tries to do this at night.  Assessed patient could benefit for more discussion on relaxation how it is tied to anxiety.  Noted patient's good coping with anxiety of recognizing what she can and cannot control helping to decrease her anxiety, also noting when she escalates to do a reset as helpful for de-escalation of anxiety.  Therapist added what we do have control over is what we frame things and in therapy we guide people toward helpful ways of framing.  Noted therapy also helpful for expression of feelings to help with coping.  Therapist will explore chapter on self-esteem with patient, as well as introducing her to CBT for anxiety and depression.  Look for ways patient can make changes that would be beneficial for her ? ?Suicidal/Homicidal: No ? ?Plan: Return again in 2 weeks.2.  Look more at chapter on self-esteem to provide education, look at anxiety and depression CBT, complete treatment plan continue to explore referral for psychiatric evaluation ? ?Diagnosis: No diagnosis found.  Major depressive disorder, recurrent, moderate, anxiety disorder unspecified type ? ?Collaboration of Care: Other none needed ? ?Patient/Guardian was advised Release of Information must be obtained prior to any record release in order to collaborate their care with an outside provider.  Patient/Guardian was advised if they have not already done so to contact the registration department to sign all necessary forms in order for [place] to release information regarding their care.  ? ?Consent: Patient/Guardian gives verbal consent for treatment and assignment of benefits for services provided during this visit. Patient/Guardian expressed understanding and agreed to proceed.  ? ?Cordella Register, LCSW ?09/08/2021 ? ?

## 2021-09-22 ENCOUNTER — Ambulatory Visit (INDEPENDENT_AMBULATORY_CARE_PROVIDER_SITE_OTHER): Payer: BC Managed Care – PPO | Admitting: Licensed Clinical Social Worker

## 2021-09-22 DIAGNOSIS — F331 Major depressive disorder, recurrent, moderate: Secondary | ICD-10-CM

## 2021-09-22 NOTE — Progress Notes (Signed)
?Virtual Visit via Video Note ? ?I connected with Amber Mcmillan on 09/22/21 at  8:00 AM EDT by a video enabled telemedicine application and verified that I am speaking with the correct person using two identifiers. ? ?Location: ?Patient: home ?Provider: office ?  ?I discussed the limitations of evaluation and management by telemedicine and the availability of in person appointments. The patient expressed understanding and agreed to proceed. ? ?I discussed the assessment and treatment plan with the patient. The patient was provided an opportunity to ask questions and all were answered. The patient agreed with the plan and demonstrated an understanding of the instructions. ?  ?The patient was advised to call back or seek an in-person evaluation if the symptoms worsen or if the condition fails to improve as anticipated. ? ?I provided 50 minutes of non-face-to-face time during this encounter. ? ?THERAPIST PROGRESS NOTE ? ?Session Time: 8:00 AM to 8:50 AM ? ?Participation Level: Active ? ?Behavioral Response: CasualAlertappropriate ? ?Type of Therapy: Individual Therapy ? ?Treatment Goals addressed: Anxiety, depression, self-esteem, self care, grief ? ?ProgressTowards Goals: Progressing ?-actively worked on processing grief through grief counseling ?Interventions: Solution Focused, Strength-based, Supportive, and Other: coping, grief ? ?Summary: Amber Mcmillan is a 46 y.o. female who presents with no symptoms of anxiety and depression. Have been good since last appointment. Use good coping.  Therapist reviewed her coping and she said 1 is to reset things and another find something else to do to keep mind busy.  Therapist noted distraction is helpful also distress tolerance which means use it and come back to his situation 1 year calmer and help with better coping.  Noted helpfulness of therapy for patient to express her feelings and she says does keep things inside talks to best friend about a lot of stuff  but not everything. Better than last year.  Backed away from therapy in the past and her thinking get better on her own which she has done. Last year dad passed and a lot going on back to back. Appointment with PCP prescribed Prozac and recommendation to do therapy.  Since patient brought up loss of her father last year talked about grief in session.  Patient shares Grief comes and goes you have your moments been ok. Passed August 26 he had stroke he had one in August nobody told her he lived in [place]. His wife didn't tell her about the first one. She told patient as a reason that Dad was private. She said that he thought he was ok. He was going to rehab. He could walk and move around just couldn't speak. Second call said  ?Dad sick another stroke and the doctor talked to her not good not come back and get there as soon as she could. Left work flew out the next day to get there he couldn't respond. He was sleeping. Not extremely close mom said didn't get along that they were so much alike, strained but knew each other love each other. Never saw Dad sick, exercise everyday and took care of himself. For her should have called more. Didn't know what get on the end of the phone whether good or whether he was cursing kept her from calling. A lot of not calling as much was about protecting peace had enough stressors. Hurt when didn't know about the first stroke.  When she went patient saw dad every day. Her daughter lives there and expressed seen him in rehab and she said gave him a hug and patient  does feel robbed of that. Called her about making choices about hospice or life support. Big thing was follow his wishes knew if he wasn't going to be able to depend on others. Agreed to do the comfort care. Wished he passed when there he didn't passed two days later. Didn't want her to be on her house alone didn't work out. She has his necklace with ashes. Music a way she connected he loved jazz, hear lyric of songs that come  to mind and think about him. Try to get phone voicemail can't find it another phone.  Does not remember password but would like to get into it therapist noted this would be a Higher education careers adviser for her but also reminded patient other ways of finding connection. ? ?Therapist reviewed symptoms noted patient doing better reviewed whether to continue therapy decided having sessions once a month helpful patient could add to skills she uses to cope through education, education related to grief also helpful in coping with grief.  Today we had a session on grief counseling Therapist describe ways grief counseling can be helpful.  First to know different feelings patient has her normal part of grieving.  Grieving can come in waves helpful to talk to someone if patient may be struggling someone who would not judge her and let patient tell her story which we started to do in session.  Discussed how helpful to talk about any regrets therapist working with patient to see these were normal reactions.  The same time validating patient for some of her reactions around grief.  Noted different models and metaphors of grief that can be helpful when the continuing bonds you can continue to learn to remember them they can continue to live on in your memories and heart.  Can look at grief like a wound, discussed the model of going back and forth from loss to present and future and more patient is present future oriented more she is healing.  Discussed rituals and customs that can help 1 to grieve, expressing her grief as part of the healing process.  Noted patient having his ashes in a necklace has been a powerful way for her to connect with her loved 1.  Therapist provided base and support for patient as she shared her thoughts and feelings in session ? ?Suicidal/Homicidal: No ? ?Plan: Return again in 4 weeks.2.  Look at handout on grief, chapter on self-esteem, CBT handout for anxiety, book on ruminating thoughts, relaxation CBT, look at  therapist said self-care handout ? ?Diagnosis: Major depressive disorder, recurrent, moderate, anxiety disorder unspecified type ? ?Collaboration of Care: Other none needed ? ?Patient/Guardian was advised Release of Information must be obtained prior to any record release in order to collaborate their care with an outside provider. Patient/Guardian was advised if they have not already done so to contact the registration department to sign all necessary forms in order for [place] to release information regarding their care.  ? ?Consent: Patient/Guardian gives verbal consent for treatment and assignment of benefits for services provided during this visit. Patient/Guardian expressed understanding and agreed to proceed.  ? ?Cordella Register, LCSW ?09/22/2021 ? ?

## 2021-10-07 ENCOUNTER — Encounter: Payer: Self-pay | Admitting: Medical-Surgical

## 2021-10-11 NOTE — Progress Notes (Signed)
?  HPI with pertinent ROS:  ? ?CC: pressure in left neck ? ?HPI: ?Pleasant  46 year old female presenting today for evaluation of a pressure in the left side of her neck that she has noted when swallowing or when she has her head in certain positions.  This started approximately 2 weeks ago and has not improved but is also not worsened.  No pain in the neck.  No recent infections or exposures.  Denies fever, chills, dyspnea, dysphagia, night sweats, and weight loss.  No changes in hair/skin/nails.  No tremors or palpitations noted. ? ?I reviewed the past medical history, family history, social history, surgical history, and allergies today and no changes were needed.  Please see the problem list section below in epic for further details. ? ? ?Physical exam:  ? ?General: Well Developed, well nourished, and in no acute distress.  ?Neuro: Alert and oriented x3.  ?HEENT: Normocephalic, atraumatic.  Palpable firm swelling to the left side of the thyroid, nontender. ?Skin: Warm and dry. ?Cardiac: Regular rate and rhythm, no murmurs rubs or gallops, no lower extremity edema.  ?Respiratory: Clear to auscultation bilaterally. Not using accessory muscles, speaking in full sentences. ? ?Impression and Recommendations:   ? ?1. Lump in neck ?Unclear etiology.  Fine-needle aspiration in January of left thyroid nodule was negative for malignancy.  We will recheck her thyroid function as well as a CBC with differential for any lymphocytosis.  Ordering CT with contrast for further evaluation.  Consider possible surgical referral with compressive symptoms. ? ?No follow-ups on file. ?___________________________________________ ?Clearnce Sorrel, DNP, APRN, FNP-BC ?Primary Care and Sports Medicine ?[place] ?

## 2021-10-12 ENCOUNTER — Encounter: Payer: Self-pay | Admitting: Medical-Surgical

## 2021-10-12 ENCOUNTER — Ambulatory Visit: Payer: BC Managed Care – PPO | Admitting: Medical-Surgical

## 2021-10-12 VITALS — BP 129/87 | HR 73 | Resp 20 | Ht 62.0 in | Wt 196.2 lb

## 2021-10-12 DIAGNOSIS — R221 Localized swelling, mass and lump, neck: Secondary | ICD-10-CM | POA: Diagnosis not present

## 2021-10-13 ENCOUNTER — Other Ambulatory Visit: Payer: Self-pay | Admitting: Medical-Surgical

## 2021-10-13 ENCOUNTER — Ambulatory Visit (INDEPENDENT_AMBULATORY_CARE_PROVIDER_SITE_OTHER): Payer: BC Managed Care – PPO

## 2021-10-13 DIAGNOSIS — R221 Localized swelling, mass and lump, neck: Secondary | ICD-10-CM

## 2021-10-13 LAB — CBC WITH DIFFERENTIAL/PLATELET
Absolute Monocytes: 896 cells/uL (ref 200–950)
Basophils Absolute: 26 cells/uL (ref 0–200)
Basophils Relative: 0.3 %
Eosinophils Absolute: 87 cells/uL (ref 15–500)
Eosinophils Relative: 1 %
HCT: 43.8 % (ref 35.0–45.0)
Hemoglobin: 14.5 g/dL (ref 11.7–15.5)
Lymphs Abs: 2584 cells/uL (ref 850–3900)
MCH: 30 pg (ref 27.0–33.0)
MCHC: 33.1 g/dL (ref 32.0–36.0)
MCV: 90.5 fL (ref 80.0–100.0)
MPV: 11.4 fL (ref 7.5–12.5)
Monocytes Relative: 10.3 %
Neutro Abs: 5107 cells/uL (ref 1500–7800)
Neutrophils Relative %: 58.7 %
Platelets: 396 10*3/uL (ref 140–400)
RBC: 4.84 10*6/uL (ref 3.80–5.10)
RDW: 13.4 % (ref 11.0–15.0)
Total Lymphocyte: 29.7 %
WBC: 8.7 10*3/uL (ref 3.8–10.8)

## 2021-10-13 LAB — THYROID PANEL WITH TSH
Free Thyroxine Index: 2.5 (ref 1.4–3.8)
T3 Uptake: 34 % (ref 22–35)
T4, Total: 7.3 ug/dL (ref 5.1–11.9)
TSH: 1.36 mIU/L

## 2021-10-13 MED ORDER — IOHEXOL 300 MG/ML  SOLN
100.0000 mL | Freq: Once | INTRAMUSCULAR | Status: AC | PRN
  Administered 2021-10-13: 75 mL via INTRAVENOUS

## 2021-10-14 ENCOUNTER — Other Ambulatory Visit: Payer: Self-pay | Admitting: Medical-Surgical

## 2021-10-14 DIAGNOSIS — E041 Nontoxic single thyroid nodule: Secondary | ICD-10-CM

## 2021-10-19 ENCOUNTER — Ambulatory Visit (INDEPENDENT_AMBULATORY_CARE_PROVIDER_SITE_OTHER): Payer: BC Managed Care – PPO

## 2021-10-19 DIAGNOSIS — E041 Nontoxic single thyroid nodule: Secondary | ICD-10-CM

## 2021-10-20 ENCOUNTER — Ambulatory Visit (HOSPITAL_COMMUNITY): Payer: BC Managed Care – PPO | Admitting: Licensed Clinical Social Worker

## 2021-10-20 ENCOUNTER — Encounter: Payer: Self-pay | Admitting: Medical-Surgical

## 2021-10-20 DIAGNOSIS — E041 Nontoxic single thyroid nodule: Secondary | ICD-10-CM

## 2021-10-20 NOTE — Progress Notes (Signed)
Therapist contacted patient for session by My Chart and she did not respond session is a no show. ?

## 2021-11-12 ENCOUNTER — Ambulatory Visit
Admission: RE | Admit: 2021-11-12 | Discharge: 2021-11-12 | Disposition: A | Discharge status: Home / Self Care | Payer: BC Managed Care – PPO | Source: Ambulatory Visit | Attending: Medical-Surgical | Admitting: Medical-Surgical

## 2021-11-12 DIAGNOSIS — R928 Other abnormal and inconclusive findings on diagnostic imaging of breast: Secondary | ICD-10-CM

## 2022-02-23 ENCOUNTER — Other Ambulatory Visit: Payer: Self-pay | Admitting: Medical-Surgical

## 2022-04-28 ENCOUNTER — Ambulatory Visit (INDEPENDENT_AMBULATORY_CARE_PROVIDER_SITE_OTHER): Payer: BC Managed Care – PPO

## 2022-04-28 DIAGNOSIS — Z1231 Encounter for screening mammogram for malignant neoplasm of breast: Secondary | ICD-10-CM | POA: Diagnosis not present

## 2022-05-14 ENCOUNTER — Other Ambulatory Visit: Payer: Self-pay

## 2022-05-14 DIAGNOSIS — R7303 Prediabetes: Secondary | ICD-10-CM

## 2022-05-24 ENCOUNTER — Other Ambulatory Visit: Payer: Self-pay

## 2022-06-09 ENCOUNTER — Ambulatory Visit (INDEPENDENT_AMBULATORY_CARE_PROVIDER_SITE_OTHER): Payer: BC Managed Care – PPO | Admitting: Medical-Surgical

## 2022-06-09 ENCOUNTER — Encounter: Payer: Self-pay | Admitting: Medical-Surgical

## 2022-06-09 VITALS — BP 110/72 | HR 64 | Ht 62.0 in | Wt 189.0 lb

## 2022-06-09 DIAGNOSIS — Z0000 Encounter for general adult medical examination without abnormal findings: Secondary | ICD-10-CM | POA: Diagnosis not present

## 2022-06-09 DIAGNOSIS — R030 Elevated blood-pressure reading, without diagnosis of hypertension: Secondary | ICD-10-CM | POA: Diagnosis not present

## 2022-06-09 DIAGNOSIS — R7303 Prediabetes: Secondary | ICD-10-CM

## 2022-06-09 DIAGNOSIS — E041 Nontoxic single thyroid nodule: Secondary | ICD-10-CM

## 2022-06-09 NOTE — Progress Notes (Signed)
Complete physical exam  Patient: Amber Mcmillan   DOB: 1975/10/12   46 y.o. Female  MRN: [id number]  Subjective:    Chief Complaint  Patient presents with   Annual Exam    Amber Mcmillan is a 46 y.o. female who presents today for a complete physical exam. She reports consuming a  meat free  diet for the past 3 weeks. The patient does not participate in regular exercise at present. She generally feels well. She reports sleeping well. She does not have additional problems to discuss today.    Most recent fall risk assessment:    10/12/2021    9:52 AM  Fall Risk   Falls in the past year? 0  Number falls in past yr: 0  Injury with Fall? 0  Risk for fall due to : No Fall Risks  Follow up Falls evaluation completed     Most recent depression screenings:    06/09/2022   10:41 AM 10/12/2021    9:52 AM  PHQ 2/9 Scores  PHQ - 2 Score 3 0  PHQ- 9 Score 14     Vision:Within last year, Dental: No current dental problems and Receives regular dental care, and STD: The patient denies history of sexually transmitted disease.    Patient Care Team: Samuel Bouche, NP as PCP - General (Nurse Practitioner)   Outpatient Medications Prior to Visit  Medication Sig   aspirin-acetaminophen-caffeine (EXCEDRIN MIGRAINE) [phone] MG tablet Take by mouth every 6 (six) hours as needed.   gabapentin (NEURONTIN) 300 MG capsule One tab PO qHS for a week, then BID for a week, then TID. May double weekly to a max of 3,'600mg'$ /day (Patient taking differently: as needed. One tab PO qHS for a week, then BID for a week, then TID. May double weekly to a max of 3,'600mg'$ /day)   meloxicam (MOBIC) 15 MG tablet TAKE 1/2 TO 1 TABLET (7.5-15 MG TOTAL) BY MOUTH DAILY (Patient taking differently: as needed.)   mometasone (ELOCON) 0.1 % ointment APPLY TO AFFECTED AREA EVERY DAY AS NEEDED   Multiple Vitamins-Minerals (MULTIVITAMIN GUMMIES ADULT PO) Take by mouth daily. Take 2 gummies   No facility-administered  medications prior to visit.    Review of Systems  Constitutional:  Negative for chills, fever, malaise/fatigue and weight loss.  HENT:  Negative for congestion, ear pain, hearing loss, sinus pain and sore throat.        Thyroid nodule getting bigger. Now has a swollen lymph node on the left next to it.   Eyes:  Negative for blurred vision, photophobia and pain.  Respiratory:  Negative for cough, shortness of breath and wheezing.   Cardiovascular:  Negative for chest pain, palpitations and leg swelling.  Gastrointestinal:  Negative for abdominal pain, constipation, diarrhea, heartburn, nausea and vomiting.  Genitourinary:  Negative for dysuria, frequency and urgency.  Musculoskeletal:  Negative for falls and neck pain.  Skin:  Negative for itching and rash.  Neurological:  Negative for dizziness, weakness and headaches.  Endo/Heme/Allergies:  Negative for polydipsia. Does not bruise/bleed easily.  Psychiatric/Behavioral:  Negative for depression, substance abuse and suicidal ideas. The patient is not nervous/anxious.       Objective:    BP 110/72   Pulse 64   Ht '5\' 2"'$  (1.575 m)   Wt 189 lb (85.7 kg)   SpO2 100%   BMI 34.57 kg/m    Physical Exam Constitutional:      General: She is not in acute distress.  Appearance: Normal appearance. She is not ill-appearing.  HENT:     Head: Normocephalic and atraumatic.     Right Ear: Tympanic membrane, ear canal and external ear normal. There is no impacted cerumen.     Left Ear: Tympanic membrane, ear canal and external ear normal. There is no impacted cerumen.     Nose: Nose normal. No congestion or rhinorrhea.     Mouth/Throat:     Mouth: Mucous membranes are moist.     Pharynx: No oropharyngeal exudate or posterior oropharyngeal erythema.  Eyes:     General: No scleral icterus.       Right eye: No discharge.        Left eye: No discharge.     Extraocular Movements: Extraocular movements intact.     Conjunctiva/sclera:  Conjunctivae normal.     Pupils: Pupils are equal, round, and reactive to light.  Neck:     Thyroid: Thyromegaly present.     Vascular: No carotid bruit or JVD.     Trachea: Trachea normal.   Cardiovascular:     Rate and Rhythm: Normal rate and regular rhythm.     Pulses: Normal pulses.     Heart sounds: Normal heart sounds. No murmur heard.    No friction rub. No gallop.  Pulmonary:     Effort: Pulmonary effort is normal. No respiratory distress.     Breath sounds: Normal breath sounds. No wheezing.  Abdominal:     General: Bowel sounds are normal. There is no distension.     Palpations: Abdomen is soft.     Tenderness: There is no abdominal tenderness. There is no guarding.  Musculoskeletal:        General: Normal range of motion.     Cervical back: Normal range of motion and neck supple.  Skin:    General: Skin is warm and dry.  Neurological:     Mental Status: She is alert and oriented to person, place, and time.     Cranial Nerves: No cranial nerve deficit.  Psychiatric:        Mood and Affect: Mood normal.        Behavior: Behavior normal.        Thought Content: Thought content normal.        Judgment: Judgment normal.     No results found for any visits on 06/09/22.     Assessment & Plan:    Routine Health Maintenance and Physical Exam  Immunization History  Administered Date(s) Administered   Moderna Sars-Covid-2 Vaccination 09/05/2019, 10/03/2019   Tdap 09/16/2017    Health Maintenance  Topic Date Due   HIV Screening  Never done   Hepatitis C Screening  Never done   INFLUENZA VACCINE  09/12/2022 (Originally 01/12/2022)   COLONOSCOPY (Pts 45-81yr Insurance coverage will need to be confirmed)  08/13/2026   DTaP/Tdap/Td (2 - Td or Tdap) 09/17/2027   HPV VACCINES  Aged Out   COVID-19 Vaccine  Discontinued    Discussed health benefits of physical activity, and encouraged her to engage in regular exercise appropriate for her age and condition.  1. Annual  physical exam Checking labs as below. UTD on preventative care. Wellness information provided with AVS. - Lipid panel - COMPLETE METABOLIC PANEL WITH GFR - CBC with Differential/Platelet  2. Prediabetes Checking A1c today.  - Hemoglobin A1c  3. Elevated blood pressure reading Looks great today. Continue low sodium diet. Recommend regular intentional exercise with a goal for weight loss to a healthy  weight.   4. Thyroid nodule Checking TSH. With the enlargement and palpable lymph node, she is quite concerned. Repeating thyroid US today.  - TSH - US THYROID; Future   Return in about 1 year (around 06/10/2023) for annual physical exam.     Samuel Bouche, NP

## 2022-06-10 ENCOUNTER — Encounter: Payer: Self-pay | Admitting: Medical-Surgical

## 2022-06-10 LAB — CBC WITH DIFFERENTIAL/PLATELET
Absolute Monocytes: 842 cells/uL (ref 200–950)
Basophils Absolute: 17 cells/uL (ref 0–200)
Basophils Relative: 0.2 %
Eosinophils Absolute: 77 cells/uL (ref 15–500)
Eosinophils Relative: 0.9 %
HCT: 41.7 % (ref 35.0–45.0)
Hemoglobin: 14.4 g/dL (ref 11.7–15.5)
Lymphs Abs: 2516 cells/uL (ref 850–3900)
MCH: 30.7 pg (ref 27.0–33.0)
MCHC: 34.5 g/dL (ref 32.0–36.0)
MCV: 88.9 fL (ref 80.0–100.0)
MPV: 11.3 fL (ref 7.5–12.5)
Monocytes Relative: 9.9 %
Neutro Abs: 5049 cells/uL (ref 1500–7800)
Neutrophils Relative %: 59.4 %
Platelets: 319 10*3/uL (ref 140–400)
RBC: 4.69 10*6/uL (ref 3.80–5.10)
RDW: 13.2 % (ref 11.0–15.0)
Total Lymphocyte: 29.6 %
WBC: 8.5 10*3/uL (ref 3.8–10.8)

## 2022-06-10 LAB — COMPLETE METABOLIC PANEL WITH GFR
AG Ratio: 1.3 (calc) (ref 1.0–2.5)
ALT: 13 U/L (ref 6–29)
AST: 13 U/L (ref 10–35)
Albumin: 4.1 g/dL (ref 3.6–5.1)
Alkaline phosphatase (APISO): 61 U/L (ref 31–125)
BUN: 15 mg/dL (ref 7–25)
CO2: 27 mmol/L (ref 20–32)
Calcium: 9.3 mg/dL (ref 8.6–10.2)
Chloride: 105 mmol/L (ref 98–110)
Creat: 0.88 mg/dL (ref 0.50–0.99)
Globulin: 3.2 g/dL (calc) (ref 1.9–3.7)
Glucose, Bld: 92 mg/dL (ref 65–99)
Potassium: 4.5 mmol/L (ref 3.5–5.3)
Sodium: 140 mmol/L (ref 135–146)
Total Bilirubin: 0.4 mg/dL (ref 0.2–1.2)
Total Protein: 7.3 g/dL (ref 6.1–8.1)
eGFR: 81 mL/min/{1.73_m2} (ref >=60)

## 2022-06-10 LAB — HEMOGLOBIN A1C
Hgb A1c MFr Bld: 6.2 % of total Hgb — ABNORMAL HIGH (ref <5.7)
Mean Plasma Glucose: 131 mg/dL
eAG (mmol/L): 7.3 mmol/L

## 2022-06-10 LAB — LIPID PANEL
Cholesterol: 211 mg/dL — ABNORMAL HIGH (ref <200)
HDL: 66 mg/dL (ref >=50)
LDL Cholesterol (Calc): 129 mg/dL (calc) — ABNORMAL HIGH
Non-HDL Cholesterol (Calc): 145 mg/dL (calc) — ABNORMAL HIGH (ref <130)
Total CHOL/HDL Ratio: 3.2 (calc) (ref <5.0)
Triglycerides: 69 mg/dL (ref <150)

## 2022-06-10 LAB — TSH: TSH: 0.97 mIU/L

## 2022-06-10 MED ORDER — BLOOD GLUCOSE MONITOR KIT: PACK | 0 refills | Status: DC

## 2022-06-10 MED ORDER — OZEMPIC (0.25 OR 0.5 MG/DOSE) 2 MG/3ML ~~LOC~~ SOPN: 0.2500 mg | PEN_INJECTOR | SUBCUTANEOUS | 0 refills | Status: DC

## 2022-06-11 ENCOUNTER — Other Ambulatory Visit: Payer: Self-pay

## 2022-06-11 MED ORDER — BLOOD GLUCOSE MONITOR KIT: PACK | 0 refills | Status: AC

## 2022-06-15 ENCOUNTER — Ambulatory Visit (INDEPENDENT_AMBULATORY_CARE_PROVIDER_SITE_OTHER): Payer: BC Managed Care – PPO

## 2022-06-15 DIAGNOSIS — E041 Nontoxic single thyroid nodule: Secondary | ICD-10-CM | POA: Diagnosis not present

## 2022-07-05 ENCOUNTER — Other Ambulatory Visit: Payer: Self-pay | Admitting: Medical-Surgical

## 2022-08-05 ENCOUNTER — Other Ambulatory Visit: Payer: Self-pay | Admitting: Medical-Surgical

## 2022-08-05 MED ORDER — OZEMPIC (0.25 OR 0.5 MG/DOSE) 2 MG/3ML ~~LOC~~ SOPN: 0.2500 mg | PEN_INJECTOR | SUBCUTANEOUS | 0 refills | Status: DC

## 2022-08-05 NOTE — Telephone Encounter (Signed)
Attempted call to patient to schld f/u visit - last note in chart  06/11/2023- states to touch base after 4 weeks on Ozempic.

## 2022-08-16 ENCOUNTER — Encounter: Payer: Self-pay | Admitting: Medical-Surgical

## 2022-08-16 ENCOUNTER — Ambulatory Visit: Payer: BC Managed Care – PPO | Admitting: Medical-Surgical

## 2022-08-16 VITALS — BP 118/78 | HR 64 | Resp 20 | Ht 62.0 in | Wt 188.8 lb

## 2022-08-16 DIAGNOSIS — R7303 Prediabetes: Secondary | ICD-10-CM

## 2022-08-16 MED ORDER — OZEMPIC (0.25 OR 0.5 MG/DOSE) 2 MG/3ML ~~LOC~~ SOPN: 0.5000 mg | PEN_INJECTOR | SUBCUTANEOUS | 0 refills | Status: DC

## 2022-08-16 NOTE — Progress Notes (Signed)
   Established Patient Office Visit  Subjective   Patient ID: Amber Mcmillan, female   DOB: 02/14/1976 Age: 47 y.o. MRN: [id number]   Chief Complaint  Patient presents with   Follow-up   MEDICATION   HPI Pleasant 47 year old female presenting today for follow-up on Ozempic.  She has completed a little over 2 months of Ozempic 0.25 mg weekly.  She has been tolerating the medication well overall however she does note some mild headaches the first day or 2 after an injection.  She is also had some issues with constipation.  She has increased her water and veggie intake which seems to help some.  Has started to exercise but is not doing so on a consistent basis.  Working to incorporate this more frequently.  Objective:    Vitals:   08/16/22 1331  BP: 118/78  Pulse: 64  Resp: 20  Height: '5\' 2"'$  (1.575 m)  Weight: 188 lb 12.8 oz (85.6 kg)  SpO2: 100%  BMI (Calculated): 34.52    Physical Exam Vitals reviewed.  Constitutional:      General: She is not in acute distress.    Appearance: Normal appearance. She is not ill-appearing.  HENT:     Head: Normocephalic and atraumatic.  Cardiovascular:     Rate and Rhythm: Normal rate and regular rhythm.     Pulses: Normal pulses.     Heart sounds: Normal heart sounds.  Pulmonary:     Effort: Pulmonary effort is normal. No respiratory distress.     Breath sounds: Normal breath sounds. No wheezing, rhonchi or rales.  Skin:    General: Skin is warm and dry.  Neurological:     Mental Status: She is alert and oriented to person, place, and time.  Psychiatric:        Mood and Affect: Mood normal.        Behavior: Behavior normal.        Thought Content: Thought content normal.        Judgment: Judgment normal.   No results found for this or any previous visit (from the past 24 hour(s)).     The 10-year ASCVD risk score (Arnett DK, et al., 2019) is: 0.9%   Values used to calculate the score:     Age: 47 years     Sex: Female      Is Non-Hispanic African American: Yes     Diabetic: No     Tobacco smoker: No     Systolic Blood Pressure: 123456 mmHg     Is BP treated: No     HDL Cholesterol: 66 mg/dL     Total Cholesterol: 211 mg/dL   Assessment & Plan:   1. Prediabetes Has been doing well on Ozempic overall.  Increase Ozempic to 0.5 mg weekly.  Advised to monitor for worsening side effects and constipation.  Okay to use over-the-counter stool softeners or MiraLAX if needed for constipation.  Recommend increasing exercise to no less than 3 times weekly.  Return in about 4 months (around 12/16/2022) for prediabetes/weight check.  ___________________________________________ Clearnce Sorrel, DNP, APRN, FNP-BC Primary Care and [place]

## 2022-09-06 ENCOUNTER — Ambulatory Visit: Payer: BC Managed Care – PPO | Admitting: Medical-Surgical

## 2022-09-06 VITALS — BP 102/69 | HR 77 | Resp 20 | Ht 62.0 in | Wt 188.6 lb

## 2022-09-06 DIAGNOSIS — R7303 Prediabetes: Secondary | ICD-10-CM

## 2022-09-06 DIAGNOSIS — L918 Other hypertrophic disorders of the skin: Secondary | ICD-10-CM

## 2022-09-06 MED ORDER — OZEMPIC (0.25 OR 0.5 MG/DOSE) 2 MG/3ML ~~LOC~~ SOPN: 0.5000 mg | PEN_INJECTOR | SUBCUTANEOUS | 1 refills | Status: DC

## 2022-09-06 NOTE — Progress Notes (Signed)
Established Patient Office Visit  Subjective   Patient ID: Amber Mcmillan, female   DOB: 1975-08-29 Age: 47 y.o. MRN: [id number]   Chief Complaint  Patient presents with   SKIN TAG REMOVAL    GROIN AREA   HPI Pleasant 47 year old female presenting today for the following:  Skin tag: Has a skin tag on the left upper thigh near the groin that is excessively bothersome.  She would like to have this removed.  Prediabetes: Taking Ozempic 0.5 mg weekly, tolerating well without side effects.  Feels like the medication is helping some and is requesting a 63-month refill.   Objective:    Vitals:   09/06/22 1036  BP: 102/69  Pulse: 77  Resp: 20  Height: 5\' 2"  (1.575 m)  Weight: 188 lb 9.6 oz (85.5 kg)  SpO2: 100%  BMI (Calculated): 34.49   Physical Exam Vitals reviewed.  Constitutional:      General: She is not in acute distress.    Appearance: Normal appearance. She is not ill-appearing.  HENT:     Head: Normocephalic and atraumatic.  Cardiovascular:     Rate and Rhythm: Normal rate and regular rhythm.     Pulses: Normal pulses.     Heart sounds: Normal heart sounds.  Pulmonary:     Effort: Pulmonary effort is normal. No respiratory distress.     Breath sounds: Normal breath sounds. No wheezing, rhonchi or rales.  Skin:    General: Skin is warm and dry.     Comments: 2-3 mm flesh tone skin tag on the left upper thigh near the inguinal crease, no erythema, swelling, or tenderness.  Neurological:     Mental Status: She is alert and oriented to person, place, and time.  Psychiatric:        Mood and Affect: Mood normal.        Behavior: Behavior normal.        Thought Content: Thought content normal.        Judgment: Judgment normal.   No results found for this or any previous visit (from the past 24 hour(s)).     The 10-year ASCVD risk score (Arnett DK, et al., 2019) is: 0.5%   Values used to calculate the score:     Age: 30 years     Sex: Female     Is  Non-Hispanic African American: Yes     Diabetic: No     Tobacco smoker: No     Systolic Blood Pressure: [id number] mmHg     Is BP treated: No     HDL Cholesterol: 66 mg/dL     Total Cholesterol: 211 mg/dL   Assessment & Plan:   1. Skin tag Procedure: Skin tag removal Informed consent:  Discussed risks (permanent scarring, infection, pain, bleeding, bruising, redness, and recurrence of the lesion) and benefits of the procedure, as well as the alternatives.  She is aware that skin tags are benign lesions, and their removal is often not considered medically necessary.  Informed consent was obtained. Anesthesia: cryotherapy  The area was prepared and draped in a standard fashion. Snip removal was performed.   Antibiotic ointment and a sterile dressing were applied.   The patient tolerated procedure well. The patient was instructed on post-op care.   Number of lesions removed:  1  2. Prediabetes Refilling Ozempic at 0.5 mg weekly.  Advised that if we need to increase her dose, this can be done at any time.  Return if symptoms worsen or  fail to improve.  ___________________________________________ Clearnce Sorrel, DNP, APRN, FNP-BC Primary Care and [place]

## 2022-11-09 ENCOUNTER — Encounter: Payer: Self-pay | Admitting: Medical-Surgical

## 2022-12-17 ENCOUNTER — Encounter: Payer: Self-pay | Admitting: Medical-Surgical

## 2022-12-17 ENCOUNTER — Ambulatory Visit: Payer: BC Managed Care – PPO | Admitting: Medical-Surgical

## 2022-12-17 VITALS — BP 127/84 | HR 77 | Ht 62.0 in | Wt 184.1 lb

## 2022-12-17 DIAGNOSIS — L304 Erythema intertrigo: Secondary | ICD-10-CM | POA: Diagnosis not present

## 2022-12-17 DIAGNOSIS — R7303 Prediabetes: Secondary | ICD-10-CM | POA: Diagnosis not present

## 2022-12-17 LAB — POCT GLYCOSYLATED HEMOGLOBIN (HGB A1C): Hemoglobin A1C: 5.6 % (ref 4.0–5.6)

## 2022-12-17 MED ORDER — NYSTATIN 100000 UNIT/GM EX POWD: 1.0000 | Freq: Three times a day (TID) | CUTANEOUS | 0 refills | Status: DC

## 2022-12-17 MED ORDER — SEMAGLUTIDE (1 MG/DOSE) 4 MG/3ML ~~LOC~~ SOPN: 1.0000 mg | PEN_INJECTOR | SUBCUTANEOUS | 1 refills | Status: DC

## 2022-12-17 NOTE — Progress Notes (Signed)
        Established patient visit  History, exam, impression, and plan:  1. Prediabetes Pleasant 47 year old female presenting today for reevaluation of prediabetes.  Has been taking Ozempic 0.5 mg weekly, tolerating well without side effects.  Notes that she has had a little bit of increased constipation however she drinks a tea that helps regulate her bowel movements.  Notes that there are occasions where she has to remind herself to eat however feels like she is craving sweets more than she used to for starting the medication.  Prior hemoglobin A1c at 6.2%.  Recheck today at 5.6%.  We discussed continuing Ozempic at her current dose as her A1c is good or trialing the 1 mg weekly dose for additional weight loss benefits.  She would like to increase the dose to 1 mg weekly and continue working on diet and exercise. - POCT HgB A1C  2. Intertrigo With her weight loss, she does have some additional skin in the abdominal area.  Has noted some irritation on each side where her skin folds over.  Has tried Hibiclens/chlorhexidine, witch hazel, Neosporin, and Vaseline.  Occasionally uses small pieces of gauze to help protect the area and prevent chafing.  Symptoms consistent with intertrigo.  Treating with nystatin powder 3 times daily as needed to the affected area.   Procedures performed this visit: None.  Return in about 6 months (around 06/19/2023) for prediabetes/weight follow up.  __________________________________ Thayer Ohm, DNP, APRN, FNP-BC Primary Care and Sports Medicine [facility] [place]

## 2023-01-28 ENCOUNTER — Encounter: Payer: Self-pay | Admitting: Medical-Surgical

## 2023-01-28 MED ORDER — CLOTRIMAZOLE-BETAMETHASONE 1-0.05 % EX CREA: 1.0000 | TOPICAL_CREAM | Freq: Two times a day (BID) | CUTANEOUS | 3 refills | Status: AC

## 2023-02-23 ENCOUNTER — Encounter: Payer: Self-pay | Admitting: Medical-Surgical

## 2023-03-03 ENCOUNTER — Other Ambulatory Visit: Payer: Self-pay

## 2023-03-03 ENCOUNTER — Ambulatory Visit
Admission: EM | Admit: 2023-03-03 | Discharge: 2023-03-03 | Disposition: A | Discharge status: Home / Self Care | Payer: BC Managed Care – PPO | Attending: Internal Medicine | Admitting: Internal Medicine

## 2023-03-03 DIAGNOSIS — L0201 Cutaneous abscess of face: Secondary | ICD-10-CM | POA: Diagnosis not present

## 2023-03-03 MED ORDER — AMOXICILLIN-POT CLAVULANATE 875-125 MG PO TABS: 1.0000 | ORAL_TABLET | Freq: Two times a day (BID) | ORAL | 0 refills | Status: DC

## 2023-03-03 NOTE — Discharge Instructions (Addendum)

## 2023-03-03 NOTE — ED Triage Notes (Signed)
Abscess to right neck since monday

## 2023-03-03 NOTE — ED Provider Notes (Signed)
[facility]    CSN: [id number] Arrival date & time: 03/03/23  1430      History   Chief Complaint Chief Complaint  Patient presents with   Abscess    HPI Amber Mcmillan is a 47 y.o. female.   Amber Mcmillan is a 47 y.o. female presenting for chief complaint of abscess to the right chin that started 3 days ago on Monday, February 28, 2023.  She noticed a smaller lesion to the right chin when symptoms first began and abscess has grown significantly in size and pain since onset 3 days ago.   Area is very tender to touch, warm, swollen, and red.She has been applying warm compresses to the area and has noted some yellow/purulent drainage from the wound.  No recent trauma/injury to the mouth or chin, recent dental work, changes in voice sounds, sore throat, fever/chills, neck pain, or recent antibiotic/steroid use.  No history of immunosuppression.  No recent antibiotic/steroid use.      Past Medical History:  Diagnosis Date   Anemia    "younger"   Benign cyst of right breast 08/09/2017   Depression    Fatty infiltration of liver 07/04/2017   GERD (gastroesophageal reflux disease)    Headache    Heart murmur    "as a baby,nothing as a adult"   Nephrolithiasis    Neuromuscular disorder (HCC)    sciatica-left   Obesity    Prediabetes 06/19/2017   Thyroid disease    "lump-benign bx 06/16/21    Patient Active Problem List   Diagnosis Date Noted   Left lumbar radiculitis 04/21/2020   Abnormal peripheral vision of right eye 10/17/2017   Elevated blood pressure reading 09/16/2017   Class 2 obesity due to excess calories without serious comorbidity in adult 09/16/2017   Primary insomnia 09/16/2017   Benign cyst of right breast 08/09/2017   Fatty infiltration of liver 07/04/2017   Renal mass, left 07/04/2017   Family history of colon cancer 06/27/2017   Other specified disorders of kidney and ureter 06/27/2017   History of nephrolithiasis 06/27/2017    Prediabetes 06/19/2017   History of abnormal mammogram 06/17/2017   H/O hysterectomy for benign disease 06/17/2017   Abnormal CT scan, kidney 06/17/2017    Past Surgical History:  Procedure Laterality Date   ABDOMINAL HYSTERECTOMY     TUBAL LIGATION      OB History   No obstetric history on file.      Home Medications    Prior to Admission medications   Medication Sig Start Date End Date Taking? Authorizing Provider  amoxicillin-clavulanate (AUGMENTIN) 875-125 MG tablet Take 1 tablet by mouth every 12 (twelve) hours. 03/03/23  Yes Carlisle Beers, FNP  aspirin-acetaminophen-caffeine (EXCEDRIN MIGRAINE) [phone] MG tablet Take by mouth every 6 (six) hours as needed.    [provider]  blood glucose meter kit and supplies KIT Dispense based on patient and insurance preference. Use up to four times daily as directed. Please include lancets, test strips, control solution. 06/11/22   Christen Butter, NP  gabapentin (NEURONTIN) 300 MG capsule One tab PO qHS for a week, then BID for a week, then TID. May double weekly to a max of 3,600mg /day Patient taking differently: as needed. One tab PO qHS for a week, then BID for a week, then TID. May double weekly to a max of 3,600mg /day 06/05/21   Christen Butter, NP  glucose blood (ACCU-CHEK GUIDE) test strip USE UP TO 4 TIMES  DAILY AS DIRECTED 07/06/22   Christen Butter, NP  meloxicam (MOBIC) 15 MG tablet TAKE 1/2 TO 1 TABLET (7.5-15 MG TOTAL) BY MOUTH DAILY Patient taking differently: as needed. 07/02/21   Christen Butter, NP  mometasone (ELOCON) 0.1 % ointment APPLY TO AFFECTED AREA EVERY DAY AS NEEDED 02/24/22   Christen Butter, NP  Multiple Vitamins-Minerals (MULTIVITAMIN GUMMIES ADULT PO) Take by mouth daily. Take 2 gummies    [provider]  nystatin powder Apply 1 Application topically 3 (three) times daily. 12/17/22   Christen Butter, NP  Semaglutide, 1 MG/DOSE, 4 MG/3ML SOPN Inject 1 mg as directed once a week. 12/17/22   Christen Butter, NP     Family History Family History  Problem Relation Age of Onset   Hypertension Mother    Gout Mother    Hyperlipidemia Father    Hypertension Brother    Gout Brother    Diabetes Maternal Aunt    Renal Disease Maternal Aunt    Colon cancer Paternal Uncle    Colon cancer Paternal Uncle    Colon cancer Maternal Grandmother    Esophageal cancer Neg Hx    Rectal cancer Neg Hx    Stomach cancer Neg Hx     Social History Social History   Tobacco Use   Smoking status: Never    Passive exposure: Never   Smokeless tobacco: Never  Vaping Use   Vaping status: Never Used  Substance Use Topics   Alcohol use: Not Currently   Drug use: No     Allergies   Morphine and Latex   Review of Systems Review of Systems Per HPI  Physical Exam Triage Vital Signs ED Triage Vitals  Encounter Vitals Group     BP 03/03/23 1442 (!) 141/85     Systolic BP Percentile --      Diastolic BP Percentile --      Pulse Rate 03/03/23 1442 79     Resp 03/03/23 1442 (!) 86     Temp 03/03/23 1442 98.2 F (36.8 C)     Temp Source 03/03/23 1442 Oral     SpO2 03/03/23 1442 99 %     Weight --      Height --      Head Circumference --      Peak Flow --      Pain Score 03/03/23 1444 7     Pain Loc --      Pain Education --      Exclude from Growth Chart --    No data found.  Updated Vital Signs BP (!) 141/85 (BP Location: Left Arm)   Pulse 79   Temp 98.2 F (36.8 C) (Oral)   Resp (!) 86   SpO2 99%   Visual Acuity Right Eye Distance:   Left Eye Distance:   Bilateral Distance:    Right Eye Near:   Left Eye Near:    Bilateral Near:     Physical Exam Vitals and nursing note reviewed.  Constitutional:      Appearance: She is not ill-appearing or toxic-appearing.  HENT:     Head: Normocephalic and atraumatic.     Right Ear: Hearing and external ear normal.     Left Ear: Hearing and external ear normal.     Nose: Nose normal.     Mouth/Throat:     Lips: Pink.  Eyes:      General: Lids are normal. Vision grossly intact. Gaze aligned appropriately.     Extraocular Movements: Extraocular  movements intact.     Conjunctiva/sclera: Conjunctivae normal.  Neck:     Trachea: Trachea and phonation normal.      Comments: 3-4 cm in diameter nonfluctuant abscess with surrounding area of erythema, induration, warmth, and underlying soft tissue swelling.  Normal phonation.  See image below for further details. Pulmonary:     Effort: Pulmonary effort is normal.  Musculoskeletal:     Cervical back: Normal range of motion and neck supple. Normal range of motion.  Lymphadenopathy:     Head:     Right side of head: Submental adenopathy present.     Cervical: No cervical adenopathy.  Skin:    General: Skin is warm and dry.     Capillary Refill: Capillary refill takes less than 2 seconds.     Findings: Abscess (Chin abscess, see image below.) present. No rash.  Neurological:     General: No focal deficit present.     Mental Status: She is alert and oriented to person, place, and time. Mental status is at baseline.     Cranial Nerves: No dysarthria or facial asymmetry.  Psychiatric:        Mood and Affect: Mood normal.        Speech: Speech normal.        Behavior: Behavior normal.        Thought Content: Thought content normal.        Judgment: Judgment normal.      UC Treatments / Results  Labs (all labs ordered are listed, but only abnormal results are displayed) Labs Reviewed - No data to display  EKG   Radiology No results found.  Procedures Procedures (including critical care time)  Medications Ordered in UC Medications - No data to display  Initial Impression / Assessment and Plan / UC Course  I have reviewed the triage vital signs and the nursing notes.  Pertinent labs & imaging results that were available during my care of the patient were reviewed by me and considered in my medical decision making (see chart for details).   1.  Abscess of  chin Abscess is immature and not quite ready to drain.  Discussed options for treatment with patient who agrees to proceed with antibiotic and warm compresses until abscess matures.  She may return to clinic to have this drained in a few days should the abscess fail to fully drain on its own.  Wound care discussed. Warm compresses recommended to allow wound to drain further.  Augmentin antibiotic as prescribed twice daily for 7 days.  Tylenol and ibuprofen as needed for pain.  Counseled patient on potential for adverse effects with medications prescribed/recommended today, strict ER and return-to-clinic precautions discussed, patient verbalized understanding.    Final Clinical Impressions(s) / UC Diagnoses   Final diagnoses:  Abscess of chin     Discharge Instructions      Your abscess has been evaluated in the clinic and appears to be infected, but is not quite ready for drainage.   I would like for you to perform warm compresses to the area frequently and continue taking over the counter medications for pain and inflammation as directed.   Start taking antibiotic sent to pharmacy as directed. This will help reduce infection to area and help the abscess mature/drain.   If abscess does not begin to drain on its own over the next few days and becomes softer, please return to urgent care for this to be drained appropriately.   If you have any  fever, chills, nausea, vomiting, or worsening pain/swelling to the area, please return to urgent care for reevaluation.   ED Prescriptions     Medication Sig Dispense Auth. Provider   amoxicillin-clavulanate (AUGMENTIN) 875-125 MG tablet Take 1 tablet by mouth every 12 (twelve) hours. 14 tablet Carlisle Beers, FNP      PDMP not reviewed this encounter.   Carlisle Beers, [place] 03/03/23 1553

## 2023-05-09 ENCOUNTER — Other Ambulatory Visit: Payer: Self-pay | Admitting: Medical-Surgical

## 2023-05-09 DIAGNOSIS — Z1231 Encounter for screening mammogram for malignant neoplasm of breast: Secondary | ICD-10-CM

## 2023-05-15 HISTORY — PX: THYROIDECTOMY, PARTIAL: SHX18

## 2023-05-26 ENCOUNTER — Ambulatory Visit
Admission: RE | Admit: 2023-05-26 | Discharge: 2023-05-26 | Disposition: A | Discharge status: Home / Self Care | Payer: BC Managed Care – PPO | Source: Ambulatory Visit

## 2023-05-26 DIAGNOSIS — Z1231 Encounter for screening mammogram for malignant neoplasm of breast: Secondary | ICD-10-CM

## 2023-05-31 ENCOUNTER — Encounter: Payer: Self-pay | Admitting: Medical-Surgical

## 2023-05-31 ENCOUNTER — Ambulatory Visit (INDEPENDENT_AMBULATORY_CARE_PROVIDER_SITE_OTHER): Payer: BC Managed Care – PPO | Admitting: Medical-Surgical

## 2023-05-31 VITALS — BP 103/61 | HR 75 | Resp 20 | Ht 62.0 in | Wt 178.4 lb

## 2023-05-31 DIAGNOSIS — G4700 Insomnia, unspecified: Secondary | ICD-10-CM | POA: Diagnosis not present

## 2023-05-31 DIAGNOSIS — M5416 Radiculopathy, lumbar region: Secondary | ICD-10-CM | POA: Diagnosis not present

## 2023-05-31 DIAGNOSIS — R7303 Prediabetes: Secondary | ICD-10-CM

## 2023-05-31 DIAGNOSIS — Z0000 Encounter for general adult medical examination without abnormal findings: Secondary | ICD-10-CM

## 2023-05-31 MED ORDER — GABAPENTIN 300 MG PO CAPS: ORAL_CAPSULE | ORAL | 3 refills | Status: DC

## 2023-05-31 MED ORDER — TRAZODONE HCL 50 MG PO TABS: 25.0000 mg | ORAL_TABLET | Freq: Every evening | ORAL | 3 refills | Status: DC | PRN

## 2023-05-31 MED ORDER — SEMAGLUTIDE (1 MG/DOSE) 4 MG/3ML ~~LOC~~ SOPN: 1.0000 mg | PEN_INJECTOR | SUBCUTANEOUS | 1 refills | Status: DC

## 2023-05-31 NOTE — Patient Instructions (Signed)
Preventive Care 40-49 Years Old, Female Preventive care refers to lifestyle choices and visits with your health care provider that can promote health and wellness. Preventive care visits are also called wellness exams. What can I expect for my preventive care visit? Counseling Your health care provider may ask you questions about your: Medical history, including: Past medical problems. Family medical history. Pregnancy history. Current health, including: Menstrual cycle. Method of birth control. Emotional well-being. Home life and relationship well-being. Sexual activity and sexual health. Lifestyle, including: Alcohol, nicotine or tobacco, and drug use. Access to firearms. Diet, exercise, and sleep habits. Work and work environment. Sunscreen use. Safety issues such as seatbelt and bike helmet use. Physical exam Your health care provider will check your: Height and weight. These may be used to calculate your BMI (body mass index). BMI is a measurement that tells if you are at a healthy weight. Waist circumference. This measures the distance around your waistline. This measurement also tells if you are at a healthy weight and may help predict your risk of certain diseases, such as type 2 diabetes and high blood pressure. Heart rate and blood pressure. Body temperature. Skin for abnormal spots. What immunizations do I need?  Vaccines are usually given at various ages, according to a schedule. Your health care provider will recommend vaccines for you based on your age, medical history, and lifestyle or other factors, such as travel or where you work. What tests do I need? Screening Your health care provider may recommend screening tests for certain conditions. This may include: Lipid and cholesterol levels. Diabetes screening. This is done by checking your blood sugar (glucose) after you have not eaten for a while (fasting). Pelvic exam and Pap test. Hepatitis B test. Hepatitis C  test. HIV (human immunodeficiency virus) test. STI (sexually transmitted infection) testing, if you are at risk. Lung cancer screening. Colorectal cancer screening. Mammogram. Talk with your health care provider about when you should start having regular mammograms. This may depend on whether you have a family history of breast cancer. BRCA-related cancer screening. This may be done if you have a family history of breast, ovarian, tubal, or peritoneal cancers. Bone density scan. This is done to screen for osteoporosis. Talk with your health care provider about your test results, treatment options, and if necessary, the need for more tests. Follow these instructions at home: Eating and drinking  Eat a diet that includes fresh fruits and vegetables, whole grains, lean protein, and low-fat dairy products. Take vitamin and mineral supplements as recommended by your health care provider. Do not drink alcohol if: Your health care provider tells you not to drink. You are pregnant, may be pregnant, or are planning to become pregnant. If you drink alcohol: Limit how much you have to 0-1 drink a day. Know how much alcohol is in your drink. In the [place] one drink equals one 12 oz bottle of beer (355 mL), one 5 oz glass of wine (148 mL), or one 1 oz glass of hard liquor (44 mL). Lifestyle Brush your teeth every morning and night with fluoride toothpaste. Floss one time each day. Exercise for at least 30 minutes 5 or more days each week. Do not use any products that contain nicotine or tobacco. These products include cigarettes, chewing tobacco, and vaping devices, such as e-cigarettes. If you need help quitting, ask your health care provider. Do not use drugs. If you are sexually active, practice safe sex. Use a condom or other form of protection to   prevent STIs. If you do not wish to become pregnant, use a form of birth control. If you plan to become pregnant, see your health care provider for a  prepregnancy visit. Take aspirin only as told by your health care provider. Make sure that you understand how much to take and what form to take. Work with your health care provider to find out whether it is safe and beneficial for you to take aspirin daily. Find healthy ways to manage stress, such as: Meditation, yoga, or listening to music. Journaling. Talking to a trusted person. Spending time with friends and family. Minimize exposure to UV radiation to reduce your risk of skin cancer. Safety Always wear your seat belt while driving or riding in a vehicle. Do not drive: If you have been drinking alcohol. Do not ride with someone who has been drinking. When you are tired or distracted. While texting. If you have been using any mind-altering substances or drugs. Wear a helmet and other protective equipment during sports activities. If you have firearms in your house, make sure you follow all gun safety procedures. Seek help if you have been physically or sexually abused. What's next? Visit your health care provider once a year for an annual wellness visit. Ask your health care provider how often you should have your eyes and teeth checked. Stay up to date on all vaccines. This information is not intended to replace advice given to you by your health care provider. Make sure you discuss any questions you have with your health care provider. Document Revised: 11/26/2020 Document Reviewed: 11/26/2020 Elsevier Patient Education  2024 [employer]vier Inc.  

## 2023-05-31 NOTE — Progress Notes (Signed)
Complete physical exam  Patient: Amber Mcmillan   DOB: [birth year]   47 y.o. Female  MRN: [id number]  Subjective:    Chief Complaint  Patient presents with   Annual Exam    Amber Mcmillan is a 47 y.o. female who presents today for a complete physical exam. She reports consuming a general diet. The patient does not participate in regular exercise at present. She generally feels well. She reports sleeping poorly. She does not have additional problems to discuss today.    Most recent fall risk assessment:    09/06/2022   11:32 AM  Fall Risk   Falls in the past year? 0  Number falls in past yr: 0  Injury with Fall? 0  Risk for fall due to : No Fall Risks  Follow up Falls evaluation completed     Most recent depression screenings:    05/31/2023    3:44 PM 09/06/2022   11:32 AM  PHQ 2/9 Scores  PHQ - 2 Score 0 0  PHQ- 9 Score 8     Vision:Not within last year  and Dental: No current dental problems and Receives regular dental care    Patient Care Team: Christen Butter, NP as PCP - General (Nurse Practitioner)   Outpatient Medications Prior to Visit  Medication Sig   aspirin-acetaminophen-caffeine (EXCEDRIN MIGRAINE) 250-250-65 MG tablet Take by mouth every 6 (six) hours as needed.   blood glucose meter kit and supplies KIT Dispense based on patient and insurance preference. Use up to four times daily as directed. Please include lancets, test strips, control solution.   glucose blood (ACCU-CHEK GUIDE) test strip USE UP TO 4 TIMES DAILY AS DIRECTED   meloxicam (MOBIC) 15 MG tablet TAKE 1/2 TO 1 TABLET (7.5-15 MG TOTAL) BY MOUTH DAILY (Patient taking differently: as needed.)   mometasone (ELOCON) 0.1 % ointment APPLY TO AFFECTED AREA EVERY DAY AS NEEDED   [DISCONTINUED] gabapentin (NEURONTIN) 300 MG capsule One tab PO qHS for a week, then BID for a week, then TID. May double weekly to a max of 3,600mg /day (Patient taking differently: as needed. One tab PO qHS for a  week, then BID for a week, then TID. May double weekly to a max of 3,600mg /day)   [DISCONTINUED] Semaglutide, 1 MG/DOSE, 4 MG/3ML SOPN Inject 1 mg as directed once a week.   [DISCONTINUED] amoxicillin-clavulanate (AUGMENTIN) 875-125 MG tablet Take 1 tablet by mouth every 12 (twelve) hours.   [DISCONTINUED] Multiple Vitamins-Minerals (MULTIVITAMIN GUMMIES ADULT PO) Take by mouth daily. Take 2 gummies   [DISCONTINUED] nystatin powder Apply 1 Application topically 3 (three) times daily.   No facility-administered medications prior to visit.    Review of Systems  Constitutional:  Negative for chills, fever, malaise/fatigue and weight loss.  HENT:  Negative for congestion, ear pain, hearing loss, sinus pain and sore throat.   Eyes:  Negative for blurred vision, photophobia and pain.  Respiratory:  Negative for cough, shortness of breath and wheezing.   Cardiovascular:  Negative for chest pain, palpitations and leg swelling.  Gastrointestinal:  Negative for abdominal pain, constipation, diarrhea, heartburn, nausea and vomiting.  Genitourinary:  Negative for dysuria, frequency and urgency.  Musculoskeletal:  Negative for falls and neck pain.  Skin:  Negative for itching and rash.  Neurological:  Negative for dizziness, weakness and headaches.  Endo/Heme/Allergies:  Negative for polydipsia. Does not bruise/bleed easily.  Psychiatric/Behavioral:  Negative for depression, substance abuse and suicidal ideas. The patient is not nervous/anxious and does not  have insomnia.      Objective:    BP 103/61 (BP Location: Left Arm, Cuff Size: Large)   Pulse 75   Resp 20   Ht 5\' 2"  (1.575 m)   Wt 178 lb 6.4 oz (80.9 kg)   SpO2 100%   BMI 32.63 kg/m    Physical Exam Constitutional:      General: She is not in acute distress.    Appearance: Normal appearance. She is not ill-appearing.  HENT:     Head: Normocephalic and atraumatic.     Right Ear: Tympanic membrane, ear canal and external ear normal.  There is no impacted cerumen.     Left Ear: Tympanic membrane, ear canal and external ear normal. There is no impacted cerumen.     Nose: Nose normal. No congestion or rhinorrhea.     Mouth/Throat:     Mouth: Mucous membranes are moist.     Pharynx: No oropharyngeal exudate or posterior oropharyngeal erythema.  Eyes:     General: No scleral icterus.       Right eye: No discharge.        Left eye: No discharge.     Extraocular Movements: Extraocular movements intact.     Conjunctiva/sclera: Conjunctivae normal.     Pupils: Pupils are equal, round, and reactive to light.  Neck:     Thyroid: No thyromegaly.     Vascular: No carotid bruit or JVD.     Trachea: Trachea normal.  Cardiovascular:     Rate and Rhythm: Normal rate and regular rhythm.     Pulses: Normal pulses.     Heart sounds: Normal heart sounds. No murmur heard.    No friction rub. No gallop.  Pulmonary:     Effort: Pulmonary effort is normal. No respiratory distress.     Breath sounds: Normal breath sounds. No wheezing.  Abdominal:     General: Bowel sounds are normal. There is no distension.     Palpations: Abdomen is soft.     Tenderness: There is no abdominal tenderness. There is no guarding.  Musculoskeletal:        General: Normal range of motion.     Cervical back: Normal range of motion and neck supple.  Lymphadenopathy:     Cervical: No cervical adenopathy.  Skin:    General: Skin is warm and dry.  Neurological:     Mental Status: She is alert and oriented to person, place, and time.     Cranial Nerves: No cranial nerve deficit.  Psychiatric:        Mood and Affect: Mood normal.        Behavior: Behavior normal.        Thought Content: Thought content normal.        Judgment: Judgment normal.   No results found for any visits on 05/31/23.     Assessment & Plan:    Routine Health Maintenance and Physical Exam  Immunization History  Administered Date(s) Administered   Moderna Sars-Covid-2  Vaccination 09/05/2019, 10/03/2019   Tdap 09/16/2017    Health Maintenance  Topic Date Due   INFLUENZA VACCINE  09/12/2023 (Originally 01/13/2023)   Hepatitis C Screening  05/30/2024 (Originally 08/19/1991)   HIV Screening  05/30/2024 (Originally 08/18/1988)   Colonoscopy  08/13/2026   DTaP/Tdap/Td (2 - Td or Tdap) 09/17/2027   HPV VACCINES  Aged Out   COVID-19 Vaccine  Discontinued    Discussed health benefits of physical activity, and encouraged her to engage in regular  exercise appropriate for her age and condition.  1. Annual physical exam (Primary) Checking labs as below.  Up-to-date on preventative care.  Wellness information provided with AVS. - Lipid panel - CBC with Differential/Platelet - CMP14+EGFR  2. Insomnia, unspecified type Adding trazodone 25-50 mg nightly as needed for sleep.  3. Prediabetes Checking hemoglobin A1c. - Hemoglobin A1c  4. Left lumbar radiculitis Refilling gabapentin for as needed use. - gabapentin (NEURONTIN) 300 MG capsule; One tab PO qHS for a week, then BID for a week, then TID. May double weekly to a max of 3,600mg /day  Dispense: 90 capsule; Refill: 3  Return in about 6 months (around 11/29/2023) for chronic disease follow up.   Christen Butter, NP

## 2023-06-01 ENCOUNTER — Other Ambulatory Visit: Payer: Self-pay | Admitting: Medical-Surgical

## 2023-06-01 DIAGNOSIS — E041 Nontoxic single thyroid nodule: Secondary | ICD-10-CM | POA: Insufficient documentation

## 2023-06-01 LAB — CBC WITH DIFFERENTIAL/PLATELET
Basophils Absolute: 0 10*3/uL (ref 0.0–0.2)
Basos: 0 %
EOS (ABSOLUTE): 0.1 10*3/uL (ref 0.0–0.4)
Eos: 1 %
Hematocrit: 40.2 % (ref 34.0–46.6)
Hemoglobin: 13.2 g/dL (ref 11.1–15.9)
Immature Grans (Abs): 0 10*3/uL (ref 0.0–0.1)
Immature Granulocytes: 0 %
Lymphocytes Absolute: 2.1 10*3/uL (ref 0.7–3.1)
Lymphs: 22 %
MCH: 30.3 pg (ref 26.6–33.0)
MCHC: 32.8 g/dL (ref 31.5–35.7)
MCV: 92 fL (ref 79–97)
Monocytes Absolute: 0.8 10*3/uL (ref 0.1–0.9)
Monocytes: 8 %
Neutrophils Absolute: 6.3 10*3/uL (ref 1.4–7.0)
Neutrophils: 69 %
Platelets: 378 10*3/uL (ref 150–450)
RBC: 4.35 x10E6/uL (ref 3.77–5.28)
RDW: 12.5 % (ref 11.7–15.4)
WBC: 9.3 10*3/uL (ref 3.4–10.8)

## 2023-06-01 LAB — CMP14+EGFR
ALT: 11 [IU]/L (ref 0–32)
AST: 12 [IU]/L (ref 0–40)
Albumin: 4.2 g/dL (ref 3.9–4.9)
Alkaline Phosphatase: 68 [IU]/L (ref 44–121)
BUN/Creatinine Ratio: 13 (ref 9–23)
BUN: 11 mg/dL (ref 6–24)
Bilirubin Total: <0.2 mg/dL (ref 0.0–1.2)
CO2: 23 mmol/L (ref 20–29)
Calcium: 8.9 mg/dL (ref 8.7–10.2)
Chloride: 104 mmol/L (ref 96–106)
Creatinine, Ser: 0.87 mg/dL (ref 0.57–1.00)
Globulin, Total: 2.8 g/dL (ref 1.5–4.5)
Glucose: 76 mg/dL (ref 70–99)
Potassium: 3.7 mmol/L (ref 3.5–5.2)
Sodium: 142 mmol/L (ref 134–144)
Total Protein: 7 g/dL (ref 6.0–8.5)
eGFR: 82 mL/min/{1.73_m2} (ref >59)

## 2023-06-01 LAB — HEMOGLOBIN A1C
Est. average glucose Bld gHb Est-mCnc: 111 mg/dL
Hgb A1c MFr Bld: 5.5 % (ref 4.8–5.6)

## 2023-06-01 LAB — LIPID PANEL
Chol/HDL Ratio: 3.7 {ratio} (ref 0.0–4.4)
Cholesterol, Total: 203 mg/dL — ABNORMAL HIGH (ref 100–199)
HDL: 55 mg/dL (ref >39)
LDL Chol Calc (NIH): 130 mg/dL — ABNORMAL HIGH (ref 0–99)
Triglycerides: 103 mg/dL (ref 0–149)
VLDL Cholesterol Cal: 18 mg/dL (ref 5–40)

## 2023-06-17 ENCOUNTER — Ambulatory Visit: Payer: BC Managed Care – PPO | Admitting: Medical-Surgical

## 2023-11-20 ENCOUNTER — Other Ambulatory Visit: Payer: Self-pay | Admitting: Medical-Surgical

## 2023-11-20 DIAGNOSIS — R7303 Prediabetes: Secondary | ICD-10-CM

## 2023-12-04 NOTE — Progress Notes (Unsigned)
   Established patient visit  History, exam, impression, and plan:  No problem-specific Assessment & Plan notes found for this encounter.   ROS  Physical Exam  Procedures performed this visit: None.  No follow-ups on file.  __________________________________ Thayer Ohm, DNP, APRN, FNP-BC Primary Care and Sports Medicine [facility]

## 2023-12-05 ENCOUNTER — Encounter: Payer: Self-pay | Admitting: Medical-Surgical

## 2023-12-05 ENCOUNTER — Ambulatory Visit: Payer: BC Managed Care – PPO | Admitting: Medical-Surgical

## 2023-12-05 VITALS — BP 110/73 | HR 74 | Resp 20 | Ht 62.0 in | Wt 184.7 lb

## 2023-12-05 DIAGNOSIS — F5101 Primary insomnia: Secondary | ICD-10-CM

## 2023-12-05 DIAGNOSIS — Z1331 Encounter for screening for depression: Secondary | ICD-10-CM

## 2023-12-05 DIAGNOSIS — R7303 Prediabetes: Secondary | ICD-10-CM

## 2023-12-05 DIAGNOSIS — G43009 Migraine without aura, not intractable, without status migrainosus: Secondary | ICD-10-CM

## 2023-12-05 DIAGNOSIS — G43909 Migraine, unspecified, not intractable, without status migrainosus: Secondary | ICD-10-CM | POA: Insufficient documentation

## 2023-12-05 LAB — POCT GLYCOSYLATED HEMOGLOBIN (HGB A1C): Hemoglobin A1C: 5.6 % (ref 4.0–5.6)

## 2023-12-05 MED ORDER — ACCU-CHEK GUIDE TEST VI STRP: ORAL_STRIP | 12 refills | Status: AC

## 2023-12-05 MED ORDER — ACCU-CHEK SOFTCLIX LANCETS MISC: 12 refills | Status: AC

## 2024-04-30 ENCOUNTER — Other Ambulatory Visit: Payer: Self-pay | Admitting: Medical-Surgical

## 2024-04-30 DIAGNOSIS — Z1231 Encounter for screening mammogram for malignant neoplasm of breast: Secondary | ICD-10-CM

## 2024-05-28 ENCOUNTER — Ambulatory Visit
Admission: EM | Admit: 2024-05-28 | Discharge: 2024-05-28 | Disposition: A | Discharge status: Home / Self Care | Attending: Family Medicine | Admitting: Family Medicine

## 2024-05-28 ENCOUNTER — Ambulatory Visit: Admission: RE | Admit: 2024-05-28 | Discharge: 2024-05-28 | Disposition: A | Source: Ambulatory Visit

## 2024-05-28 ENCOUNTER — Ambulatory Visit

## 2024-05-28 ENCOUNTER — Encounter: Payer: Self-pay | Admitting: Emergency Medicine

## 2024-05-28 DIAGNOSIS — Z1231 Encounter for screening mammogram for malignant neoplasm of breast: Secondary | ICD-10-CM

## 2024-05-28 DIAGNOSIS — M79671 Pain in right foot: Secondary | ICD-10-CM | POA: Diagnosis not present

## 2024-05-28 DIAGNOSIS — M7731 Calcaneal spur, right foot: Secondary | ICD-10-CM

## 2024-05-28 MED ORDER — OXYCODONE-ACETAMINOPHEN 5-325 MG PO TABS: 1.0000 | ORAL_TABLET | Freq: Four times a day (QID) | ORAL | 0 refills | Status: AC | PRN

## 2024-05-28 MED ORDER — CELECOXIB 200 MG PO CAPS: 200.0000 mg | ORAL_CAPSULE | Freq: Every day | ORAL | 0 refills | Status: DC

## 2024-05-28 NOTE — Discharge Instructions (Addendum)
 Advised patient of right foot x-ray results with hardcopy and image provided.  Advised patient to take Celebrex  daily with food to completion.  Advised may use Percocet daily or as needed for acute/severe right heel pain.  Patient advised of sedative effects of this medication.  Encouraged to increase daily water intake to 64 ounces per day while taking these medications.  Advised patient to follow-up with  podiatry this Friday, 06/01/2024 at 8:30 AM.

## 2024-05-28 NOTE — ED Triage Notes (Signed)
 Patient c/o right foot pain since last night.  No apparent injury.  Patient states that she was fine yesterday, went to get out of bed and cannot bare weight on her right foot at all.  Patient has taken Aleve and elevated foot, nothing helped.

## 2024-05-28 NOTE — ED Provider Notes (Signed)
 [name]    CSN: [id number] Arrival date & time: 05/28/24  1159      History   Chief Complaint Chief Complaint  Patient presents with   Foot Pain    HPI Amber Mcmillan is a 48 y.o. female.   HPI 48 year old female presents with right foot pain that began last night.  Patient reports that she is unable to bear weight on her right foot.  PMH significant for neuromuscular disorder, obesity, and thyroid  disease.  Past Medical History:  Diagnosis Date   Abnormal CT scan, kidney 06/17/2017   Allergy    Anemia    younger   Benign cyst of right breast 08/09/2017   Depression    Family history of colon cancer 06/27/2017   Fatty infiltration of liver 07/04/2017   GERD (gastroesophageal reflux disease)    H/O hysterectomy for benign disease 06/17/2017   ovaries intact     Headache    Heart murmur    as a baby,nothing as a adult   History of abnormal mammogram 06/17/2017   History of nephrolithiasis 06/27/2017   Nephrolithiasis    Neuromuscular disorder (HCC)    sciatica-left   Obesity    Prediabetes 06/19/2017   Thyroid  disease    lump-benign bx 06/16/21    Patient Active Problem List   Diagnosis Date Noted   Migraine 12/05/2023   Thyroid  nodule 06/01/2023   Left lumbar radiculitis 04/21/2020   Class 2 obesity due to excess calories without serious comorbidity in adult 09/16/2017   Primary insomnia 09/16/2017   Fatty infiltration of liver 07/04/2017   Renal mass, left 07/04/2017   Other specified disorders of kidney and ureter 06/27/2017   Prediabetes 06/19/2017    Past Surgical History:  Procedure Laterality Date   ABDOMINAL HYSTERECTOMY     TUBAL LIGATION      OB History   No obstetric history on file.      Home Medications    Prior to Admission medications  Medication Sig Start Date End Date Taking? Authorizing Provider  Accu-Chek Softclix Lancets lancets Use as instructed 12/05/23  Yes Willo Mini, NP   aspirin-acetaminophen -caffeine (EXCEDRIN MIGRAINE) 250-250-65 MG tablet Take by mouth every 6 (six) hours as needed.   Yes [provider]  blood glucose meter kit and supplies KIT Dispense based on patient and insurance preference. Use up to four times daily as directed. Please include lancets, test strips, control solution. 06/11/22  Yes Willo Mini, NP  celecoxib  (CELEBREX ) 200 MG capsule Take 1 capsule (200 mg total) by mouth daily for 15 days. 05/28/24 06/12/24 Yes Teddy Sharper, FNP  finasteride (PROSCAR) 5 MG tablet Take 2.5 mg by mouth daily. 03/19/24  Yes [provider]  glucose blood (ACCU-CHEK GUIDE TEST) test strip Use as instructed 12/05/23  Yes Jessup, Joy, NP  minoxidil (LONITEN) 2.5 MG tablet Take 1.25 mg by mouth daily. 03/19/24  Yes [provider]  mometasone  (ELOCON ) 0.1 % ointment APPLY TO AFFECTED AREA EVERY DAY AS NEEDED 02/24/22  Yes Willo Mini, NP  oxyCODONE -acetaminophen  (PERCOCET/ROXICET) 5-325 MG tablet Take 1 tablet by mouth every 6 (six) hours as needed for severe pain (pain score 7-10). 05/28/24  Yes Teddy Sharper, FNP  Semaglutide , 1 MG/DOSE, (OZEMPIC , 1 MG/DOSE,) 4 MG/3ML SOPN INJECT 1 MG ONCE A WEEK AS DIRECTED 11/21/23  Yes Willo Mini, NP    Family History Family History  Problem Relation Age of Onset   Hypertension Mother    Gout Mother    Hyperlipidemia  Father    Cancer Father    Stroke Father    Hypertension Brother    Gout Brother    Colon cancer Maternal Grandmother    Alcohol abuse Maternal Grandmother    Cancer Maternal Grandmother    Asthma Daughter    Asthma Son    Depression Son    Diabetes Maternal Aunt    Renal Disease Maternal Aunt    Depression Maternal Aunt    Kidney disease Maternal Aunt    Alcohol abuse Maternal Uncle    Colon cancer Paternal Uncle    Cancer Paternal Uncle    Colon cancer Paternal Uncle    Cancer Paternal Uncle    Esophageal cancer Neg Hx    Rectal cancer Neg Hx    Stomach cancer Neg  Hx     Social History Social History[1]   Allergies   Morphine and Latex   Review of Systems Review of Systems   Physical Exam Triage Vital Signs ED Triage Vitals  Encounter Vitals Group     BP 05/28/24 1232 (!) 142/85     Girls Systolic BP Percentile --      Girls Diastolic BP Percentile --      Boys Systolic BP Percentile --      Boys Diastolic BP Percentile --      Pulse Rate 05/28/24 1232 72     Resp 05/28/24 1232 18     Temp 05/28/24 1232 98.6 F (37 C)     Temp Source 05/28/24 1232 Oral     SpO2 05/28/24 1232 95 %     Weight 05/28/24 1230 186 lb (84.4 kg)     Height 05/28/24 1230 5' 2 (1.575 m)     Head Circumference --      Peak Flow --      Pain Score 05/28/24 1229 5     Pain Loc --      Pain Education --      Exclude from Growth Chart --    No data found.  Updated Vital Signs BP (!) 142/85 (BP Location: Right Arm)   Pulse 72   Temp 98.6 F (37 C) (Oral)   Resp 18   Ht 5' 2 (1.575 m)   Wt 186 lb (84.4 kg)   SpO2 95%   BMI 34.02 kg/m   Visual Acuity Right Eye Distance:   Left Eye Distance:   Bilateral Distance:    Right Eye Near:   Left Eye Near:    Bilateral Near:     Physical Exam Vitals and nursing note reviewed.  Constitutional:      Appearance: Normal appearance. She is normal weight.  HENT:     Head: Normocephalic and atraumatic.     Mouth/Throat:     Mouth: Mucous membranes are moist.     Pharynx: Oropharynx is clear.  Eyes:     Extraocular Movements: Extraocular movements intact.     Conjunctiva/sclera: Conjunctivae normal.     Pupils: Pupils are equal, round, and reactive to light.  Cardiovascular:     Rate and Rhythm: Normal rate and regular rhythm.     Pulses: Normal pulses.     Heart sounds: Normal heart sounds.  Pulmonary:     Effort: Pulmonary effort is normal.     Breath sounds: Normal breath sounds. No wheezing, rhonchi or rales.  Musculoskeletal:        General: Normal range of motion.     Cervical back:  Normal range of motion and neck  supple.     Comments: Right foot: Patient reporting severe pain over calcaneus and insertion site for Achilles tendon.  Skin:    General: Skin is warm and dry.  Neurological:     General: No focal deficit present.     Mental Status: She is alert and oriented to person, place, and time. Mental status is at baseline.  Psychiatric:        Mood and Affect: Mood normal.        Behavior: Behavior normal.      UC Treatments / Results  Labs (all labs ordered are listed, but only abnormal results are displayed) Labs Reviewed - No data to display  EKG   Radiology DG Foot Complete Right Result Date: 05/28/2024 EXAM: 3 or more VIEW(S) XRAY OF THE RIGHT FOOT 05/28/2024 01:28:00 PM COMPARISON: None available. CLINICAL HISTORY: Right foot pain that began last night, very painful to bear weight on foot. FINDINGS: BONES AND JOINTS: No acute fracture. Mild hallux valgus deformity. Small plantar and Achilles calcaneal spurs. SOFT TISSUES: The soft tissues are unremarkable. IMPRESSION: 1. No acute osseous abnormality. 2. Mild hallux valgus deformity. 3. Small plantar and Achilles calcaneal spurs. Electronically signed by: Waddell Calk MD 05/28/2024 01:48 PM EST RP Workstation: [id number]    Procedures Procedures (including critical care time)  Medications Ordered in UC Medications - No data to display  Initial Impression / Assessment and Plan / UC Course  I have reviewed the triage vital signs and the nursing notes.  Pertinent labs & imaging results that were available during my care of the patient were reviewed by me and considered in my medical decision making (see chart for details).     MDM: 1.  Foot pain, right-right foot x-ray results revealed above.  Patient advised with hardcopy and images provided.  Rx'd Celebrex  200 mg capsule: Take 1 capsule daily x 15 days; 2.  Calcaneal spur of the right foot-Rx'd Percocet 5/325 mg tablet: Take 1 tablet every 6  hours, as needed for severe right heel pain. Advised patient of right foot x-ray results with hardcopy and image provided.  Advised patient to take Celebrex  daily with food to completion.  Advised may use Percocet daily or as needed for acute/severe right heel pain.  Patient advised of sedative effects of this medication.  Encouraged to increase daily water intake to 64 ounces per day while taking these medications.  Advised patient to follow-up with [facility] this Friday, 06/01/2024 at 8:30 AM.  Patient discharged home, hemodynamically stable.  Work note provided to patient prior to discharge today. Final Clinical Impressions(s) / UC Diagnoses   Final diagnoses:  Foot pain, right  Calcaneal spur of right foot     Discharge Instructions      Advised patient of right foot x-ray results with hardcopy and image provided.  Advised patient to take Celebrex  daily with food to completion.  Advised may use Percocet daily or as needed for acute/severe right heel pain.  Patient advised of sedative effects of this medication.  Encouraged to increase daily water intake to 64 ounces per day while taking these medications.  Advised patient to follow-up with [place] [facility] this Friday, 06/01/2024 at 8:30 AM.     ED Prescriptions     Medication Sig Dispense Auth. Provider   celecoxib  (CELEBREX ) 200 MG capsule Take 1 capsule (200 mg total) by mouth daily for 15 days. 15 capsule Khary Schaben, FNP   oxyCODONE -acetaminophen  (PERCOCET/ROXICET) 5-325 MG tablet Take 1 tablet by  mouth every 6 (six) hours as needed for severe pain (pain score 7-10). 15 tablet Kadesha Virrueta, FNP      I have reviewed the PDMP during this encounter.    [1]  Social History Tobacco Use   Smoking status: Never    Passive exposure: Never   Smokeless tobacco: Never  Vaping Use   Vaping status: Never Used  Substance Use Topics   Alcohol use: Not Currently   Drug use: No     Teddy Sharper, FNP 05/28/24  1439

## 2024-06-01 ENCOUNTER — Encounter: Payer: Self-pay | Admitting: Podiatry

## 2024-06-01 ENCOUNTER — Ambulatory Visit: Payer: Self-pay | Admitting: Podiatry

## 2024-06-01 DIAGNOSIS — M7661 Achilles tendinitis, right leg: Secondary | ICD-10-CM

## 2024-06-01 MED ORDER — CELECOXIB 200 MG PO CAPS: 200.0000 mg | ORAL_CAPSULE | Freq: Every day | ORAL | 0 refills | Status: AC

## 2024-06-01 NOTE — Patient Instructions (Signed)

## 2024-06-01 NOTE — Progress Notes (Signed)
"  °  Subjective:  Patient ID: Amber Mcmillan, female    DOB: 1975/09/05,   MRN: [id number]  Chief Complaint  Patient presents with   Foot Pain    I have pain on the back of my foot.  I went to Urgent Care on Monday.  They said it's a spur, Achilles Calcaneal Spur. (Right)    48 y.o. female presents for concern of right heel pain that started on Sunday.  She relates she woke up and had significant pain in the back of the heel.  She relates she was having a lot of difficulty walking.  She was seen in urgent care and given pain medication as well as Celebrex .  She relates the Celebrex  has been helping.  She relates some improvement in her pain since then.  Still difficult to walk.. Denies any other pedal complaints. Denies n/v/f/c.   Past Medical History:  Diagnosis Date   Abnormal CT scan, kidney 06/17/2017   Allergy    Anemia    younger   Benign cyst of right breast 08/09/2017   Depression    Family history of colon cancer 06/27/2017   Fatty infiltration of liver 07/04/2017   GERD (gastroesophageal reflux disease)    H/O hysterectomy for benign disease 06/17/2017   ovaries intact     Headache    Heart murmur    as a baby,nothing as a adult   History of abnormal mammogram 06/17/2017   History of nephrolithiasis 06/27/2017   Nephrolithiasis    Neuromuscular disorder (HCC)    sciatica-left   Obesity    Prediabetes 06/19/2017   Thyroid  disease    lump-benign bx 06/16/21    Objective:  Physical Exam: Vascular: DP/PT pulses 2/4 bilateral. CFT <3 seconds. Normal hair growth on digits. No edema.  Skin. No lacerations or abrasions bilateral feet.  Musculoskeletal: MMT 5/5 bilateral lower extremities in DF, PF, Inversion and Eversion. Deceased ROM in DF of ankle joint.  Tender to insertion of Achilles tendon on the right.  No pain proximally along the tendon.  No pain medial calcaneal tubercle.  No pain PT tendon.  Some pain with dorsiflexion of the ankle joint limited range of  motion of the ankle. Neurological: Sensation intact to light touch.   Assessment:   1. Tendonitis, Achilles, right      Plan:  Patient was evaluated and treated and all questions answered. -Xrays reviewed no acute fractures or dislocations noted.  Significant spurring noted posterior calcaneus. -Discussed Achilles insertional tendonitis and treatment options with patient.  -Discussed stretching exercises. -Rx Celebrex  provided.  Advised to take for 2 weeks consistently and then as needed after that. - Cam boot dispensed to offload the area for the next few weeks. -Discussed if no improvement will consider MRI/PT/EPAT/PRP injections.  -Patient to return to office in 6 weeks for recheck   Asberry Failing, DPM    "

## 2024-06-05 ENCOUNTER — Ambulatory Visit: Admitting: Medical-Surgical

## 2024-06-05 VITALS — BP 116/70 | HR 66 | Ht 62.0 in | Wt 198.1 lb

## 2024-06-05 DIAGNOSIS — Z23 Encounter for immunization: Secondary | ICD-10-CM | POA: Diagnosis not present

## 2024-06-05 DIAGNOSIS — R7303 Prediabetes: Secondary | ICD-10-CM

## 2024-06-05 DIAGNOSIS — Z0000 Encounter for general adult medical examination without abnormal findings: Secondary | ICD-10-CM

## 2024-06-05 DIAGNOSIS — E785 Hyperlipidemia, unspecified: Secondary | ICD-10-CM | POA: Diagnosis not present

## 2024-06-05 DIAGNOSIS — Z8639 Personal history of other endocrine, nutritional and metabolic disease: Secondary | ICD-10-CM | POA: Diagnosis not present

## 2024-06-05 LAB — POCT GLYCOSYLATED HEMOGLOBIN (HGB A1C): Hemoglobin A1C: 6 % — AB (ref 4.0–5.6)

## 2024-06-05 MED ORDER — MOMETASONE FUROATE 0.1 % EX OINT: TOPICAL_OINTMENT | Freq: Every day | CUTANEOUS | 0 refills | Status: AC

## 2024-06-05 NOTE — Progress Notes (Signed)
 A1c elevated in the setting of increase ice cream consumption and stopping the Ozempic . Recommend reducing intake of simple carbs and concentrated sweets. Consider restarting Ozempic  injections.   Medical screening examination/treatment was performed by qualified clinical staff member and as supervising provider I was immediately available for consultation/collaboration. I have reviewed documentation and agree with assessment and plan.  Zada FREDRIK Palin, DNP, APRN, FNP-BC [facility]

## 2024-06-05 NOTE — Patient Instructions (Signed)
 Preventive Care 58-50 Years Old, Female  Preventive care refers to lifestyle choices and visits with your health care provider that can promote health and wellness. Preventive care visits are also called wellness exams.  What can I expect for my preventive care visit?  Counseling  Your health care provider may ask you questions about your:  Medical history, including:  Past medical problems.  Family medical history.  Pregnancy history.  Current health, including:  Menstrual cycle.  Method of birth control.  Emotional well-being.  Home life and relationship well-being.  Sexual activity and sexual health.  Lifestyle, including:  Alcohol, nicotine or tobacco, and drug use.  Access to firearms.  Diet, exercise, and sleep habits.  Work and work Astronomer.  Sunscreen use.  Safety issues such as seatbelt and bike helmet use.  Physical exam  Your health care provider will check your:  Height and weight. These may be used to calculate your BMI (body mass index). BMI is a measurement that tells if you are at a healthy weight.  Waist circumference. This measures the distance around your waistline. This measurement also tells if you are at a healthy weight and may help predict your risk of certain diseases, such as type 2 diabetes and high blood pressure.  Heart rate and blood pressure.  Body temperature.  Skin for abnormal spots.  What immunizations do I need?    Vaccines are usually given at various ages, according to a schedule. Your health care provider will recommend vaccines for you based on your age, medical history, and lifestyle or other factors, such as travel or where you work.  What tests do I need?  Screening  Your health care provider may recommend screening tests for certain conditions. This may include:  Lipid and cholesterol levels.  Diabetes screening. This is done by checking your blood sugar (glucose) after you have not eaten for a while (fasting).  Pelvic exam and Pap test.  Hepatitis B test.  Hepatitis C  test.  HIV (human immunodeficiency virus) test.  STI (sexually transmitted infection) testing, if you are at risk.  Lung cancer screening.  Colorectal cancer screening.  Mammogram. Talk with your health care provider about when you should start having regular mammograms. This may depend on whether you have a family history of breast cancer.  BRCA-related cancer screening. This may be done if you have a family history of breast, ovarian, tubal, or peritoneal cancers.  Bone density scan. This is done to screen for osteoporosis.  Talk with your health care provider about your test results, treatment options, and if necessary, the need for more tests.  Follow these instructions at home:  Eating and drinking    Eat a diet that includes fresh fruits and vegetables, whole grains, lean protein, and low-fat dairy products.  Take vitamin and mineral supplements as recommended by your health care provider.  Do not drink alcohol if:  Your health care provider tells you not to drink.  You are pregnant, may be pregnant, or are planning to become pregnant.  If you drink alcohol:  Limit how much you have to 0-1 drink a day.  Know how much alcohol is in your drink. In the [place] one drink equals one 12 oz bottle of beer (355 mL), one 5 oz glass of wine (148 mL), or one 1 oz glass of hard liquor (44 mL).  Lifestyle  Brush your teeth every morning and night with fluoride toothpaste. Floss one time each day.  Exercise for at least  30 minutes 5 or more days each week.  Do not use any products that contain nicotine or tobacco. These products include cigarettes, chewing tobacco, and vaping devices, such as e-cigarettes. If you need help quitting, ask your health care provider.  Do not use drugs.  If you are sexually active, practice safe sex. Use a condom or other form of protection to prevent STIs.  If you do not wish to become pregnant, use a form of birth control. If you plan to become pregnant, see your health care provider for a  prepregnancy visit.  Take aspirin only as told by your health care provider. Make sure that you understand how much to take and what form to take. Work with your health care provider to find out whether it is safe and beneficial for you to take aspirin daily.  Find healthy ways to manage stress, such as:  Meditation, yoga, or listening to music.  Journaling.  Talking to a trusted person.  Spending time with friends and family.  Minimize exposure to UV radiation to reduce your risk of skin cancer.  Safety  Always wear your seat belt while driving or riding in a vehicle.  Do not drive:  If you have been drinking alcohol. Do not ride with someone who has been drinking.  When you are tired or distracted.  While texting.  If you have been using any mind-altering substances or drugs.  Wear a helmet and other protective equipment during sports activities.  If you have firearms in your house, make sure you follow all gun safety procedures.  Seek help if you have been physically or sexually abused.  What's next?  Visit your health care provider once a year for an annual wellness visit.  Ask your health care provider how often you should have your eyes and teeth checked.  Stay up to date on all vaccines.  This information is not intended to replace advice given to you by your health care provider. Make sure you discuss any questions you have with your health care provider.  Document Revised: 11/26/2020 Document Reviewed: 11/26/2020  Elsevier Patient Education  2024 [employer].

## 2024-06-05 NOTE — Progress Notes (Signed)
 "  Complete physical exam  Patient: Amber Mcmillan   DOB: November 19, 1975   48 y.o. Female  MRN: [id number]  Subjective:    Chief Complaint  Patient presents with   Annual Exam    Annual exam - considering shingles vaccine but not yet decided - she declines all other vaccines     Amber Mcmillan is a 48 y.o. female who presents today for a complete physical exam. She reports consuming a general diet. The patient does not participate in regular exercise at present. She generally feels well. She reports sleeping fairly well. She does not have additional problems to discuss today.    Most recent fall risk assessment:    12/05/2023    3:27 PM  Fall Risk   Falls in the past year? 0  Number falls in past yr: 0  Injury with Fall? 0   Risk for fall due to : No Fall Risks  Follow up Falls evaluation completed     Data saved with a previous flowsheet row definition     Most recent depression screenings:    12/05/2023    3:27 PM 05/31/2023    3:44 PM  PHQ 2/9 Scores  PHQ - 2 Score 0 0  PHQ- 9 Score 5  8      Data saved with a previous flowsheet row definition    Vision:Not within last year  and Dental: No current dental problems  Patient Active Problem List   Diagnosis Date Noted   Dyslipidemia 06/05/2024   Migraine 12/05/2023   Thyroid  nodule 06/01/2023   Left lumbar radiculitis 04/21/2020   Class 2 obesity due to excess calories without serious comorbidity in adult 09/16/2017   Primary insomnia 09/16/2017   Fatty infiltration of liver 07/04/2017   Renal mass, left 07/04/2017   Other specified disorders of kidney and ureter 06/27/2017   Prediabetes 06/19/2017   Past Medical History:  Diagnosis Date   Abnormal CT scan, kidney 06/17/2017   Allergy    Anemia    younger   Benign cyst of right breast 08/09/2017   Depression    Family history of colon cancer 06/27/2017   Fatty infiltration of liver 07/04/2017   GERD (gastroesophageal reflux disease)    H/O  hysterectomy for benign disease 06/17/2017   ovaries intact     Headache    Heart murmur    as a baby,nothing as a adult   History of abnormal mammogram 06/17/2017   History of nephrolithiasis 06/27/2017   Nephrolithiasis    Neuromuscular disorder (HCC)    sciatica-left   Obesity    Prediabetes 06/19/2017   Thyroid  disease    lump-benign bx 06/16/21   Past Surgical History:  Procedure Laterality Date   ABDOMINAL HYSTERECTOMY     TUBAL LIGATION     Social History[1] Social History   Socioeconomic History   Marital status: Married    Spouse name: Not on file   Number of children: Not on file   Years of education: Not on file   Highest education level: Some college, no degree  Occupational History   Not on file  Tobacco Use   Smoking status: Never    Passive exposure: Never   Smokeless tobacco: Never  Vaping Use   Vaping status: Never Used  Substance and Sexual Activity   Alcohol use: Not Currently   Drug use: No   Sexual activity: Yes    Partners: Male    Birth control/protection: Surgical  Other Topics Concern  Not on file  Social History Narrative   Not on file   Social Drivers of Health   Tobacco Use: Low Risk (06/05/2024)   Patient History    Smoking Tobacco Use: Never    Smokeless Tobacco Use: Never    Passive Exposure: Never  Financial Resource Strain: Low Risk (06/05/2024)   Overall Financial Resource Strain (CARDIA)    Difficulty of Paying Living Expenses: Not hard at all  Food Insecurity: No Food Insecurity (06/05/2024)   Epic    Worried About Radiation Protection Practitioner of Food in the Last Year: Never true    Ran Out of Food in the Last Year: Never true  Transportation Needs: No Transportation Needs (06/05/2024)   Epic    Lack of Transportation (Medical): No    Lack of Transportation (Non-Medical): No  Physical Activity: Inactive (06/05/2024)   Exercise Vital Sign    Days of Exercise per Week: 0 days    Minutes of Exercise per Session: Not on file   Stress: No Stress Concern Present (06/05/2024)   [employer] of Occupational Health - Occupational Stress Questionnaire    Feeling of Stress: Only a little  Social Connections: Socially Integrated (06/05/2024)   Social Connection and Isolation Panel    Frequency of Communication with Friends and Family: More than three times a week    Frequency of Social Gatherings with Friends and Family: Once a week    Attends Religious Services: More than 4 times per year    Active Member of [employer] or Organizations: Yes    Attends Engineer, Structural: More than 4 times per year    Marital Status: Married  Catering Manager Violence: Not At Risk (06/01/2023)   Received from [facility]   HITS    Over the last 12 months how often did your partner physically hurt you?: Never    Over the last 12 months how often did your partner insult you or talk down to you?: Never    Over the last 12 months how often did your partner threaten you with physical harm?: Never    Over the last 12 months how often did your partner scream or curse at you?: Never  Depression (PHQ2-9): Medium Risk (12/05/2023)   Depression (PHQ2-9)    PHQ-2 Score: 5  Alcohol Screen: Low Risk (06/05/2024)   Alcohol Screen    Last Alcohol Screening Score (AUDIT): 1  Housing: Low Risk (06/05/2024)   Epic    Unable to Pay for Housing in the Last Year: No    Number of Times Moved in the Last Year: 0    Homeless in the Last Year: No  Utilities: Not on file  Health Literacy: Not on file   Family Status  Relation Name Status   Mother Sherron Alive   Father Clennan Deceased   Brother Brodrick (Not Specified)   DEVON Ao (Not Specified)   Daughter Myrtha Searles   Son [place] Alive   Mat Aunt [place] (Not Specified)   Mat Uncle [place] Alive   Pat Higinio Fonder (Not Specified)   Bruna Higinio Loving (Not Specified)   Neg Hx  (Not Specified)  No partnership data on file   Family History  Problem Relation Age of Onset    Hypertension Mother    Gout Mother    Hyperlipidemia Father    Cancer Father    Stroke Father    Hypertension Brother    Gout Brother    Colon cancer Maternal Grandmother    Alcohol  abuse Maternal Grandmother    Cancer Maternal Grandmother    Asthma Daughter    Asthma Son    Depression Son    Diabetes Maternal Aunt    Renal Disease Maternal Aunt    Depression Maternal Aunt    Kidney disease Maternal Aunt    Alcohol abuse Maternal Uncle    Colon cancer Paternal Uncle    Cancer Paternal Uncle    Colon cancer Paternal Uncle    Cancer Paternal Uncle    Esophageal cancer Neg Hx    Rectal cancer Neg Hx    Stomach cancer Neg Hx    Allergies[2]    Patient Care Team: Willo Mini, NP as PCP - General (Nurse Practitioner)   Show/hide medication list[3]  Review of Systems  Constitutional: Negative.   HENT: Negative.    Eyes: Negative.   Respiratory: Negative.    Cardiovascular: Negative.   Gastrointestinal: Negative.   Genitourinary: Negative.   Musculoskeletal: Negative.   Skin: Negative.   Neurological: Negative.   Endo/Heme/Allergies: Negative.   Psychiatric/Behavioral: Negative.         Objective:     BP 116/70 (BP Location: Right Arm, Patient Position: Sitting, Cuff Size: Normal)   Pulse 66   Ht 5' 2 (1.575 m)   Wt 89.9 kg   SpO2 99%   BMI 36.24 kg/m  BP Readings from Last 3 Encounters:  06/05/24 116/70  05/28/24 (!) 142/85  12/05/23 110/73   Wt Readings from Last 3 Encounters:  06/05/24 89.9 kg  05/28/24 84.4 kg  12/05/23 83.8 kg      Physical Exam Vitals and nursing note reviewed.  Constitutional:      General: She is not in acute distress.    Appearance: Normal appearance.  HENT:     Head: Normocephalic.     Right Ear: Tympanic membrane, ear canal and external ear normal. There is no impacted cerumen.     Left Ear: Tympanic membrane, ear canal and external ear normal. There is no impacted cerumen.     Nose: Nose normal.     Mouth/Throat:      Mouth: Mucous membranes are moist.     Pharynx: Oropharynx is clear.  Eyes:     Conjunctiva/sclera: Conjunctivae normal.     Pupils: Pupils are equal, round, and reactive to light.  Cardiovascular:     Rate and Rhythm: Normal rate and regular rhythm.     Pulses: Normal pulses.     Heart sounds: Normal heart sounds.  Pulmonary:     Effort: Pulmonary effort is normal.     Breath sounds: Normal breath sounds.  Abdominal:     General: Bowel sounds are normal.     Palpations: Abdomen is soft.  Musculoskeletal:        General: Normal range of motion.     Cervical back: Normal range of motion.  Feet:     Comments: Boot on left foot Skin:    General: Skin is warm and dry.  Neurological:     General: No focal deficit present.     Mental Status: She is alert and oriented to person, place, and time.  Psychiatric:        Mood and Affect: Mood normal.        Behavior: Behavior normal.        Thought Content: Thought content normal.        Judgment: Judgment normal.      No results found for any visits on 06/05/24. Last CBC  Lab Results  Component Value Date   WBC 9.3 05/31/2023   HGB 13.2 05/31/2023   HCT 40.2 05/31/2023   MCV 92 05/31/2023   MCH 30.3 05/31/2023   RDW 12.5 05/31/2023   PLT 378 05/31/2023   Last metabolic panel Lab Results  Component Value Date   GLUCOSE 76 05/31/2023   NA 142 05/31/2023   K 3.7 05/31/2023   CL 104 05/31/2023   CO2 23 05/31/2023   BUN 11 05/31/2023   CREATININE 0.87 05/31/2023   EGFR 82 05/31/2023   CALCIUM 8.9 05/31/2023   PROT 7.0 05/31/2023   ALBUMIN 4.2 05/31/2023   LABGLOB 2.8 05/31/2023   BILITOT <0.2 05/31/2023   ALKPHOS 68 05/31/2023   AST 12 05/31/2023   ALT 11 05/31/2023   Last lipids Lab Results  Component Value Date   CHOL 203 (H) 05/31/2023   HDL 55 05/31/2023   LDLCALC 130 (H) 05/31/2023   TRIG 103 05/31/2023   CHOLHDL 3.7 05/31/2023   Last hemoglobin A1c Lab Results  Component Value Date   HGBA1C 5.6  12/05/2023   Last thyroid  functions Lab Results  Component Value Date   TSH 0.97 06/09/2022   T4TOTAL 7.3 10/12/2021        Assessment & Plan:    Routine Health Maintenance and Physical Exam  Immunization History  Administered Date(s) Administered   Moderna Sars-Covid-2 Vaccination 09/05/2019, 10/03/2019   Tdap 09/16/2017    Health Maintenance  Topic Date Due   HIV Screening  Never done   Hepatitis C Screening  Never done   Hepatitis B Vaccines 19-59 Average Risk (1 of 3 - 19+ 3-dose series) Never done   Zoster Vaccines- Shingrix  (1 of 2) Never done   Pneumococcal Vaccine: 50+ Years (1 of 1 - PCV) Never done   Influenza Vaccine  09/11/2024 (Originally 01/13/2024)   Mammogram  05/28/2026   Colonoscopy  08/13/2026   DTaP/Tdap/Td (2 - Td or Tdap) 09/17/2027   HPV VACCINES  Aged Out   Meningococcal B Vaccine  Aged Out   COVID-19 Vaccine  Discontinued    Discussed health benefits of physical activity, and encouraged her to engage in regular exercise appropriate for her age and condition.  1. Annual physical exam (Primary) -Routine labs collected today - CBC with Differential - CMP14+EGFR - Lipid Profile  2. Prediabetes Previous A1C 5.5% in 2024 -POCT A1C today 6.0% -Consider restarting  - POCT HgB A1C  3. Dyslipidemia - CMP14+EGFR - Lipid Profile  4. History of thyroid  nodule -Recent levels drawn September 2025 -Recheck next year   Return in about 6 months (around 12/04/2024).     Derrek JINNY Freund, NP Student       [1]  Social History Tobacco Use   Smoking status: Never    Passive exposure: Never   Smokeless tobacco: Never  Vaping Use   Vaping status: Never Used  Substance Use Topics   Alcohol use: Not Currently   Drug use: No  [2]  Allergies Allergen Reactions   Morphine    Latex Rash  [3]  Outpatient Medications Prior to Visit  Medication Sig   Accu-Chek Softclix Lancets lancets Use as instructed   aspirin-acetaminophen -caffeine (EXCEDRIN  MIGRAINE) 250-250-65 MG tablet Take by mouth every 6 (six) hours as needed.   blood glucose meter kit and supplies KIT Dispense based on patient and insurance preference. Use up to four times daily as directed. Please include lancets, test strips, control solution.   celecoxib  (CELEBREX ) 200 MG capsule Take  1 capsule (200 mg total) by mouth daily for 15 days.   finasteride (PROSCAR) 5 MG tablet Take 2.5 mg by mouth daily.   glucose blood (ACCU-CHEK GUIDE TEST) test strip Use as instructed   minoxidil (LONITEN) 2.5 MG tablet Take 1.25 mg by mouth daily.   mometasone  (ELOCON ) 0.1 % ointment APPLY TO AFFECTED AREA EVERY DAY AS NEEDED   oxyCODONE -acetaminophen  (PERCOCET/ROXICET) 5-325 MG tablet Take 1 tablet by mouth every 6 (six) hours as needed for severe pain (pain score 7-10).   Semaglutide , 1 MG/DOSE, (OZEMPIC , 1 MG/DOSE,) 4 MG/3ML SOPN INJECT 1 MG ONCE A WEEK AS DIRECTED   No facility-administered medications prior to visit.   "

## 2024-06-06 LAB — CBC WITH DIFFERENTIAL/PLATELET
Basophils Absolute: 0 x10E3/uL (ref 0.0–0.2)
Basos: 0 %
EOS (ABSOLUTE): 0 x10E3/uL (ref 0.0–0.4)
Eos: 1 %
Hematocrit: 41.3 % (ref 34.0–46.6)
Hemoglobin: 13.8 g/dL (ref 11.1–15.9)
Immature Grans (Abs): 0 x10E3/uL (ref 0.0–0.1)
Immature Granulocytes: 0 %
Lymphocytes Absolute: 2.3 x10E3/uL (ref 0.7–3.1)
Lymphs: 27 %
MCH: 30.7 pg (ref 26.6–33.0)
MCHC: 33.4 g/dL (ref 31.5–35.7)
MCV: 92 fL (ref 79–97)
Monocytes Absolute: 0.9 x10E3/uL (ref 0.1–0.9)
Monocytes: 11 %
Neutrophils Absolute: 5.1 x10E3/uL (ref 1.4–7.0)
Neutrophils: 61 %
Platelets: 350 x10E3/uL (ref 150–450)
RBC: 4.49 x10E6/uL (ref 3.77–5.28)
RDW: 13.5 % (ref 11.7–15.4)
WBC: 8.4 x10E3/uL (ref 3.4–10.8)

## 2024-06-06 LAB — CMP14+EGFR
ALT: 12 IU/L (ref 0–32)
AST: 12 IU/L (ref 0–40)
Albumin: 4.4 g/dL (ref 3.9–4.9)
Alkaline Phosphatase: 71 IU/L (ref 41–116)
BUN/Creatinine Ratio: 14 (ref 9–23)
BUN: 13 mg/dL (ref 6–24)
Bilirubin Total: 0.3 mg/dL (ref 0.0–1.2)
CO2: 25 mmol/L (ref 20–29)
Calcium: 9.5 mg/dL (ref 8.7–10.2)
Chloride: 101 mmol/L (ref 96–106)
Creatinine, Ser: 0.94 mg/dL (ref 0.57–1.00)
Globulin, Total: 2.7 g/dL (ref 1.5–4.5)
Glucose: 93 mg/dL (ref 70–99)
Potassium: 4.2 mmol/L (ref 3.5–5.2)
Sodium: 140 mmol/L (ref 134–144)
Total Protein: 7.1 g/dL (ref 6.0–8.5)
eGFR: 74 mL/min/1.73

## 2024-06-06 LAB — LIPID PANEL
Chol/HDL Ratio: 2.8 ratio (ref 0.0–4.4)
Cholesterol, Total: 226 mg/dL — ABNORMAL HIGH (ref 100–199)
HDL: 81 mg/dL
LDL Chol Calc (NIH): 132 mg/dL — ABNORMAL HIGH (ref 0–99)
Triglycerides: 78 mg/dL (ref 0–149)
VLDL Cholesterol Cal: 13 mg/dL (ref 5–40)

## 2024-06-08 ENCOUNTER — Ambulatory Visit: Payer: Self-pay | Admitting: Medical-Surgical

## 2024-07-13 ENCOUNTER — Ambulatory Visit (INDEPENDENT_AMBULATORY_CARE_PROVIDER_SITE_OTHER): Admitting: Podiatry

## 2024-07-13 DIAGNOSIS — Z91199 Patient's noncompliance with other medical treatment and regimen due to unspecified reason: Secondary | ICD-10-CM

## 2024-07-13 NOTE — Progress Notes (Signed)
 No show

## 2024-12-04 ENCOUNTER — Ambulatory Visit: Admitting: Medical-Surgical
